# Patient Record
Sex: Female | Born: 1975 | Race: White | Hispanic: No | Marital: Married | Smoking: Former smoker
Health system: Southern US, Community
[De-identification: ages and names within clinical notes are randomized; demographics above are authoritative.]

## PROBLEM LIST (undated history)

## (undated) DIAGNOSIS — R112 Nausea with vomiting, unspecified: Secondary | ICD-10-CM

## (undated) DIAGNOSIS — N871 Moderate cervical dysplasia: Secondary | ICD-10-CM

## (undated) DIAGNOSIS — R87629 Unspecified abnormal cytological findings in specimens from vagina: Secondary | ICD-10-CM

## (undated) DIAGNOSIS — F419 Anxiety disorder, unspecified: Secondary | ICD-10-CM

## (undated) DIAGNOSIS — F32A Depression, unspecified: Secondary | ICD-10-CM

## (undated) DIAGNOSIS — Z9889 Other specified postprocedural states: Secondary | ICD-10-CM

## (undated) DIAGNOSIS — B001 Herpesviral vesicular dermatitis: Secondary | ICD-10-CM

## (undated) DIAGNOSIS — N870 Mild cervical dysplasia: Secondary | ICD-10-CM

## (undated) DIAGNOSIS — D219 Benign neoplasm of connective and other soft tissue, unspecified: Secondary | ICD-10-CM

## (undated) DIAGNOSIS — F329 Major depressive disorder, single episode, unspecified: Secondary | ICD-10-CM

## (undated) HISTORY — DX: Nausea with vomiting, unspecified: Z98.890

## (undated) HISTORY — DX: Benign neoplasm of connective and other soft tissue, unspecified: D21.9

## (undated) HISTORY — DX: Moderate cervical dysplasia: N87.1

## (undated) HISTORY — DX: Major depressive disorder, single episode, unspecified: F32.9

## (undated) HISTORY — DX: Nausea with vomiting, unspecified: R11.2

## (undated) HISTORY — DX: Depression, unspecified: F32.A

## (undated) HISTORY — DX: Mild cervical dysplasia: N87.0

## (undated) HISTORY — DX: Anxiety disorder, unspecified: F41.9

## (undated) HISTORY — DX: Herpesviral vesicular dermatitis: B00.1

## (undated) HISTORY — DX: Unspecified abnormal cytological findings in specimens from vagina: R87.629

---

## 1997-08-18 ENCOUNTER — Other Ambulatory Visit: Admission: RE | Admit: 1997-08-18 | Discharge: 1997-08-18 | Payer: Self-pay | Admitting: Obstetrics and Gynecology

## 1997-10-06 ENCOUNTER — Other Ambulatory Visit: Admission: RE | Admit: 1997-10-06 | Discharge: 1997-10-06 | Payer: Self-pay | Admitting: Obstetrics and Gynecology

## 1997-12-30 ENCOUNTER — Other Ambulatory Visit: Admission: RE | Admit: 1997-12-30 | Discharge: 1997-12-30 | Payer: Self-pay | Admitting: Gynecology

## 1998-03-31 ENCOUNTER — Other Ambulatory Visit: Admission: RE | Admit: 1998-03-31 | Discharge: 1998-03-31 | Payer: Self-pay | Admitting: Gynecology

## 1998-06-30 ENCOUNTER — Other Ambulatory Visit: Admission: RE | Admit: 1998-06-30 | Discharge: 1998-06-30 | Payer: Self-pay | Admitting: Gynecology

## 1998-10-11 ENCOUNTER — Other Ambulatory Visit: Admission: RE | Admit: 1998-10-11 | Discharge: 1998-10-11 | Payer: Self-pay | Admitting: Gynecology

## 1999-05-22 ENCOUNTER — Other Ambulatory Visit: Admission: RE | Admit: 1999-05-22 | Discharge: 1999-05-22 | Payer: Self-pay | Admitting: Gynecology

## 2000-02-15 ENCOUNTER — Other Ambulatory Visit: Admission: RE | Admit: 2000-02-15 | Discharge: 2000-02-15 | Payer: Self-pay | Admitting: Gynecology

## 2001-02-25 HISTORY — PX: APPENDECTOMY: SHX54

## 2001-02-25 HISTORY — PX: MYOMECTOMY: SHX85

## 2001-03-27 ENCOUNTER — Other Ambulatory Visit: Admission: RE | Admit: 2001-03-27 | Discharge: 2001-03-27 | Payer: Self-pay | Admitting: Gynecology

## 2001-08-04 ENCOUNTER — Inpatient Hospital Stay (HOSPITAL_COMMUNITY): Admission: RE | Admit: 2001-08-04 | Discharge: 2001-08-06 | Payer: Self-pay | Admitting: Gynecology

## 2001-08-04 ENCOUNTER — Encounter (INDEPENDENT_AMBULATORY_CARE_PROVIDER_SITE_OTHER): Payer: Self-pay | Admitting: *Deleted

## 2001-08-04 ENCOUNTER — Encounter (INDEPENDENT_AMBULATORY_CARE_PROVIDER_SITE_OTHER): Payer: Self-pay

## 2002-04-27 ENCOUNTER — Other Ambulatory Visit: Admission: RE | Admit: 2002-04-27 | Discharge: 2002-04-27 | Payer: Self-pay | Admitting: Gynecology

## 2003-06-30 ENCOUNTER — Other Ambulatory Visit: Admission: RE | Admit: 2003-06-30 | Discharge: 2003-06-30 | Payer: Self-pay | Admitting: Obstetrics and Gynecology

## 2004-08-22 ENCOUNTER — Other Ambulatory Visit: Admission: RE | Admit: 2004-08-22 | Discharge: 2004-08-22 | Payer: Self-pay | Admitting: Addiction Medicine

## 2005-05-09 ENCOUNTER — Encounter: Admission: RE | Admit: 2005-05-09 | Discharge: 2005-05-09 | Payer: Self-pay | Admitting: Obstetrics and Gynecology

## 2005-08-23 ENCOUNTER — Other Ambulatory Visit: Admission: RE | Admit: 2005-08-23 | Discharge: 2005-08-23 | Payer: Self-pay | Admitting: Obstetrics and Gynecology

## 2006-08-26 ENCOUNTER — Other Ambulatory Visit: Admission: RE | Admit: 2006-08-26 | Discharge: 2006-08-26 | Payer: Self-pay | Admitting: Obstetrics and Gynecology

## 2007-09-03 ENCOUNTER — Other Ambulatory Visit: Admission: RE | Admit: 2007-09-03 | Discharge: 2007-09-03 | Payer: Self-pay | Admitting: Obstetrics and Gynecology

## 2008-09-06 ENCOUNTER — Encounter: Payer: Self-pay | Admitting: Obstetrics and Gynecology

## 2008-09-06 ENCOUNTER — Ambulatory Visit: Payer: Self-pay | Admitting: Obstetrics and Gynecology

## 2008-09-06 ENCOUNTER — Other Ambulatory Visit: Admission: RE | Admit: 2008-09-06 | Discharge: 2008-09-06 | Payer: Self-pay | Admitting: Obstetrics and Gynecology

## 2010-02-27 ENCOUNTER — Ambulatory Visit
Admission: RE | Admit: 2010-02-27 | Discharge: 2010-02-27 | Payer: Self-pay | Source: Home / Self Care | Attending: Obstetrics and Gynecology | Admitting: Obstetrics and Gynecology

## 2010-02-27 ENCOUNTER — Other Ambulatory Visit
Admission: RE | Admit: 2010-02-27 | Discharge: 2010-02-27 | Payer: Self-pay | Source: Home / Self Care | Admitting: Obstetrics and Gynecology

## 2010-07-13 NOTE — Op Note (Signed)
[facility]  Patient:    Rebecca Howell, Rebecca Howell Visit Number: [id number] MRN: [id number]          Service Type: GYN Location: 9300 [id number] Attending Physician:  Douglass Rivers Dictated by:   Douglass Rivers, M.D. Proc. Date: 08/04/01 Admit Date:  08/04/2001                             Operative Report  PREOPERATIVE DIAGNOSES:       1. Pelvic pain.                               2. Fibroid uterus.  POSTOPERATIVE DIAGNOSES:      1. Pelvic pain.                               2. Fibroid uterus.  OPERATION:                    Myomectomy.  SURGEON:                      Douglass Rivers, M.D.  ASSISTANT:                    Nadyne Coombes. Fontaine, M.D.  ANESTHESIA:                   Spinal.  ESTIMATED BLOOD LOSS:         Minimal.  IV FLUIDS:                    Two liters of lactated Ringers.  URINE OUTPUT:                 50 cc.  FINDINGS:                     Large approximately 10 cm fundal fibroid on a thick stalk.  The uterus was markedly deviated towards the right, otherwise unremarkable.  COMPLICATIONS:                None.  PATHOLOGY:                    Fibroids.  DESCRIPTION OF PROCEDURE:     The patient was taken to the operating room and spinal anesthesia was induced.  She was placed in the supine position, prepped and draped in the usual sterile fashion.  After adequate anesthesia was ensured, a Pfannenstiel skin incision was made with the scalpel and carried to the underlying layer of fascia with electrocautery.  The fascia was scored in the midline.  The incision was extended with the Mayo scissors.  The inferior rectus fascia was grasped with Kochers and the rectus muscles were dissected off with blunt and sharp dissection.  In a similar fashion, the superior aspect of the incision was grasped with Kochers and the underlying rectus muscles were dissected off.  The rectus muscles were dissected in the midline. The peritoneum was identified and  entered bluntly.  The peritoneal incision was then extended superiorly and inferiorly.  Good visualization of the underlying bowel and bladder.  Pelvic washings were obtained, after which, the examining hand was placed in the pelvis.  The uterus and fibroid were palpable.  The fibroid was able to elevated up into the  incision and was stabilized with a Jacobs tenaculum.  Inspection showed that the fibroid was actually pedunculated, however, on a very thick stalk, extending in the high fundal region, but was noted to be free of the tube.  The inferior most aspect of the fibroid was then injected with a dilute Pitressin solution until blanching of the major vessels was noted.  The serosa was then scored circumferentially and the fibroid capsule was able to be identified and sharply dissected off.  There was essentially no fundal defect.  The defect was closed with two deep sutures of 1-0 Vicryl.  The serosa was then closed with a subfascial 3-0 plain.  Small areas of bleeding were treated with cautery.  Interceed was then placed over the incision and the uterus was gently replaced back into the pelvic cavity, prior to which the pelvis was irrigated with warm saline.  The peritoneum and muscles were noted to be hemostatic.  The fascia was then closed with 0 Vicryl in a running fashion. The subcutaneous was irrigated and the skin was closed with staples.  The patient tolerated the procedure well.  Instrument, sponge, and needle counts were correct x2.  She was given Ancef intraoperatively and transferred to the PACU in stable condition. Dictated by:   Douglass Rivers, M.D. Attending Physician:  Douglass Rivers DD:  08/04/01 TD:  08/05/01 Job: [id number]

## 2010-07-13 NOTE — Discharge Summary (Signed)
[facility]  Patient:    Rebecca Howell, Rebecca Howell Visit Number: [id number] MRN: [id number]          Service Type: GYN Location: 9300 [id number] Attending Physician:  Douglass Rivers Dictated by:   Antony Contras, [facility] Admit Date:  08/04/2001 Discharge Date: 08/06/2001                             Discharge Summary  DISCHARGE DIAGNOSES:          1. Pelvic pain.                               2. Fibroid uterus.  PROCEDURE:                    Myomectomy.  HISTORY OF PRESENT ILLNESS:   The patient is a 35 year old who was noted on a routine annual examination to have fullness in the right adnexa.  She was having breakthrough bleeding on her birth control pills and presented back for a sonohysterogram, which confirmed the absence of endometrial cavity defects but a distinct large fibroid that initially appeared subserosal and was more obviously noted to be pedunculated on a fixed stalk with color-flow Doppler that measured 8 x 6 x 6.4 x 6.9.  Ovaries were identified and were unremarkable.  The patient is otherwise healthy, negative a negative medical and surgical history, and no known drug allergies.  She was admitted for myomectomy.  HOSPITAL COURSE:              The patient was admitted on August 04, 2001 and myomectomy was performed by Dr. Douglass Rivers, assisted by Dr. Colin Broach under spinal anesthesia.  Findings included a large approximately 10 cm fundal fibroid on a thick stalk.  The uterus was markedly deviated toward the right , otherwise unremarkable.  Postoperatively the patient remained afebrile and had no difficulty voiding, and was able to be discharged on her second postoperative day in satisfactory condition.  LABORATORY DATA:              CBC hematocrit 34.4, hemoglobin 11.8, WBC 6.8; platelets 220,000.  DISPOSITION:                  Follow-up in two weeks.  DISCHARGE MEDICATIONS:        1. Tylox for pain.                               2.  Zofran ODT 4 mg q.8h p.r.n. nausea and                                  vomiting. Dictated by:   Antony Contras, [facility] Attending Physician:  Douglass Rivers DD:  08/31/01 TD:  09/02/01 Job: [id number]

## 2010-07-13 NOTE — H&P (Signed)
[facility]  Patient:    Rebecca Howell, Rebecca Howell Visit Number: [id number] MRN: [id number]          Service Type: GYN Location: 9300 [id number] Attending Physician:  Douglass Rivers Dictated by:   Douglass Rivers, M.D. Admit Date:  08/04/2001                           History and Physical  CHIEF COMPLAINT:              Pelvic pain, fibroid uterus.  HISTORY OF PRESENT ILLNESS:   The patient is a 35 year old who was noted on the routine annual examination to have fullness in the right adnexa.  The uterus was palpated distinctly separate from this area.  The patient then underwent an ultrasound which revealed the uterus to be 8.2 x 4.1 x 3.8 with what was felt to be a subserosal myoma that measured 5.8 x 8.3. x 8.2.  The patient was having breakthrough bleeding on her birth control pills at that point and presented back to the office for a sonohistogram.  The sonohistogram confirmed the absence of endometrial cavity defects.  However, a distinct large fibroid that initially appeared subserosal was more obviously noted to be pedunculated on a fixed stalk with a color flow Doppler that measured 8.6 x 6.4 x 6.9 cm.  The ovaries were identified and unremarkable.  PAST OBSTETRIC-GYNECOLOGIC HISTORY:                      Negative.  Patient is using birth control pills and condoms for contraception.  She has been in a monogamous relationship for over a year.  Her Pap smear is normal.  PAST MEDICAL HISTORY:         Negative.  PAST SURGICAL HISTORY:        Negative.  SOCIAL HISTORY:               Negative for tobacco, minimal caffeine, social alcohol, and a moderate amount of exercise.  ALLERGIES:                    Patient has no known drug allergies.  FAMILY HISTORY:               Unremarkable.  PHYSICAL EXAMINATION:  GENERAL:                      She is a well-appearing female in no acute distress.  HEENT:                        Unremarkable.  Thyroid is  smooth, nontender, and mobile.  HEART:                        Regular rate.  LUNGS:                        Clear to auscultation.  BREASTS:                      Without mass, discharge, retractions in the supine and upright position.  ABDOMEN:                      Soft, nontender.  PELVIC:  Speculum exam is normal-appearing cervix.  The vagina is pink and moist.  The uterus is deviated towards the left and unremarkable.  The fullness on the right is appreciable and pushing on the rectum.  The rectovaginal exam is confirmatory.  ASSESSMENT:                   Symptomatic pedunculated fibroid uterus.  The patient elects for a myomectomy.  All questions were addressed.  She will present in the morning for definitive therapy. Dictated by:   Douglass Rivers, M.D. Attending Physician:  Douglass Rivers DD:  08/03/01 TD:  08/03/01 Job: [id number]

## 2010-09-13 ENCOUNTER — Ambulatory Visit (INDEPENDENT_AMBULATORY_CARE_PROVIDER_SITE_OTHER): Payer: 59 | Admitting: Obstetrics and Gynecology

## 2010-09-13 DIAGNOSIS — N390 Urinary tract infection, site not specified: Secondary | ICD-10-CM

## 2010-09-13 DIAGNOSIS — R82998 Other abnormal findings in urine: Secondary | ICD-10-CM

## 2010-09-24 ENCOUNTER — Ambulatory Visit (INDEPENDENT_AMBULATORY_CARE_PROVIDER_SITE_OTHER): Payer: 59 | Admitting: *Deleted

## 2010-09-24 DIAGNOSIS — R82998 Other abnormal findings in urine: Secondary | ICD-10-CM

## 2010-09-24 DIAGNOSIS — Z5189 Encounter for other specified aftercare: Secondary | ICD-10-CM

## 2010-09-25 ENCOUNTER — Telehealth: Payer: Self-pay

## 2010-09-25 DIAGNOSIS — N390 Urinary tract infection, site not specified: Secondary | ICD-10-CM

## 2010-09-25 MED ORDER — CIPROFLOXACIN HCL 500 MG PO TABS
250.0000 mg | ORAL_TABLET | Freq: Two times a day (BID) | ORAL | Status: AC
Start: 2010-09-25 — End: 2010-10-05

## 2010-09-25 NOTE — Telephone Encounter (Signed)
I changed patient's antibiotic and called in for her. Tell the patient is waiting at the pharmacy. If this doesn't work I would like her to come in for a pelvic ultrasound.

## 2010-09-25 NOTE — Telephone Encounter (Signed)
PT. NOTIFIED BY CELL # VOICEMAIL. SEE DR. GOTTSEGENS NOTE BELOW.

## 2010-09-25 NOTE — Telephone Encounter (Signed)
PER DR. GOTTSEGEN IS PT ASYMPTOMATIC? NO PER PATIENT-C-O MILD PELVIC DISCOMFORT. PHARMACY UPDATED IF YOU NEED TO GIVE HER MORE RX. IT CAN BE ESCRIBED BY YOU & THEN CLOSE ENCOUNTER.

## 2010-09-26 ENCOUNTER — Telehealth: Payer: Self-pay

## 2010-09-26 NOTE — Telephone Encounter (Signed)
If they gave her 500 mg tablets than half a tablet twice a day as correct. If they gave her 250 mg tablets then half a tablet twice a day is not correct.

## 2010-09-26 NOTE — Telephone Encounter (Signed)
PT STATES SHE RECEIVED 250MG . STRENGTH. THAT WAS WHAT WAS ROUTED TO HER PHARMACY. DO YOU WANT TO ECRIBE MORE PILLS OF CORRECT STRENGTH & HOW MANY DAYS TO TAKE ALL TOGETHER? #7DAYS?

## 2010-09-26 NOTE — Telephone Encounter (Signed)
PT. PICKED UP [facility] & WANTED TO VERIFY DIRECTIONS. IT STATES TAKE 1/2 PILL BID-IS THIS CORRECT?

## 2010-09-26 NOTE — Telephone Encounter (Signed)
The dose should be 250 mg twice a day for 7 days.

## 2010-09-26 NOTE — Telephone Encounter (Signed)
VERIFIED DOSE WITH [facility] & NOTIFIED PT OF DR. GOTTTSEGENS NOTE BELOW BY CELL #[email or web address].

## 2010-10-15 ENCOUNTER — Encounter: Payer: Self-pay | Admitting: *Deleted

## 2010-10-15 ENCOUNTER — Telehealth: Payer: Self-pay | Admitting: *Deleted

## 2010-10-15 ENCOUNTER — Other Ambulatory Visit: Payer: Self-pay | Admitting: *Deleted

## 2010-10-15 DIAGNOSIS — B001 Herpesviral vesicular dermatitis: Secondary | ICD-10-CM | POA: Insufficient documentation

## 2010-10-15 DIAGNOSIS — N870 Mild cervical dysplasia: Secondary | ICD-10-CM | POA: Insufficient documentation

## 2010-10-15 DIAGNOSIS — N871 Moderate cervical dysplasia: Secondary | ICD-10-CM | POA: Insufficient documentation

## 2010-10-15 DIAGNOSIS — N949 Unspecified condition associated with female genital organs and menstrual cycle: Secondary | ICD-10-CM

## 2010-10-15 NOTE — Telephone Encounter (Signed)
Patient called with ongoing problem with uti symptoms.  Per DG note, he wanted to check an U/S here in the office.  We went ahead and scheduled for patient on Wed Aug 22nd. Patient will be out of town on [place] and Fri. Patient did start period, but is sure things will be lighter when time to come in for appoinment.

## 2010-10-16 NOTE — Telephone Encounter (Signed)
Patient called and cancelled the [place] scheduled for tomorrow. She believes the her discomfort was probably from her period starting. She will back if Sx's persist. KW

## 2010-10-17 ENCOUNTER — Ambulatory Visit: Payer: 59 | Admitting: Obstetrics and Gynecology

## 2010-10-17 ENCOUNTER — Other Ambulatory Visit: Payer: 59

## 2010-11-12 ENCOUNTER — Other Ambulatory Visit: Payer: Self-pay | Admitting: Obstetrics and Gynecology

## 2010-11-13 NOTE — Telephone Encounter (Addendum)
PHARMACY ALSO FAXED OVER RX. SEE ON FRONT OF CHART.

## 2010-11-14 NOTE — Telephone Encounter (Signed)
SENT FAX TO PHARMACY SENT RX CAN NOT BE E-SCRIBED.

## 2011-02-06 ENCOUNTER — Other Ambulatory Visit: Payer: Self-pay | Admitting: *Deleted

## 2011-02-06 MED ORDER — ALPRAZOLAM 0.5 MG PO TABS: ORAL_TABLET | ORAL | Status: DC

## 2011-02-06 NOTE — Telephone Encounter (Signed)
rx called in

## 2011-05-23 ENCOUNTER — Other Ambulatory Visit: Payer: Self-pay | Admitting: Women's Health

## 2011-05-23 NOTE — Telephone Encounter (Signed)
rx called in

## 2011-06-12 ENCOUNTER — Encounter: Payer: Self-pay | Admitting: Obstetrics and Gynecology

## 2011-06-12 ENCOUNTER — Other Ambulatory Visit (HOSPITAL_COMMUNITY)
Admission: RE | Admit: 2011-06-12 | Discharge: 2011-06-12 | Disposition: A | Discharge status: Home / Self Care | Payer: 59 | Source: Ambulatory Visit | Attending: Obstetrics and Gynecology | Admitting: Obstetrics and Gynecology

## 2011-06-12 ENCOUNTER — Ambulatory Visit (INDEPENDENT_AMBULATORY_CARE_PROVIDER_SITE_OTHER): Payer: 59 | Admitting: Obstetrics and Gynecology

## 2011-06-12 VITALS — BP 114/64 | Ht 63.5 in | Wt 123.0 lb

## 2011-06-12 DIAGNOSIS — Z113 Encounter for screening for infections with a predominantly sexual mode of transmission: Secondary | ICD-10-CM

## 2011-06-12 DIAGNOSIS — Z01419 Encounter for gynecological examination (general) (routine) without abnormal findings: Secondary | ICD-10-CM | POA: Insufficient documentation
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
## 2011-06-13 LAB — CBC WITH DIFFERENTIAL/PLATELET
Eosinophils Absolute: 0.2 10*3/uL (ref 0.0–0.7)
Eosinophils Relative: 2 % (ref 0–5)
HCT: 43.6 % (ref 36.0–46.0)
Lymphs Abs: 2.1 10*3/uL (ref 0.7–4.0)
MCH: 31.2 pg (ref 26.0–34.0)
Monocytes Absolute: 0.5 10*3/uL (ref 0.1–1.0)
Platelets: 267 10*3/uL (ref 150–400)
RBC: 4.75 MIL/uL (ref 3.87–5.11)

## 2011-06-13 LAB — URINALYSIS W MICROSCOPIC + REFLEX CULTURE
Casts: NONE SEEN
Crystals: NONE SEEN
Ketones, ur: 15 mg/dL — AB
Leukocytes, UA: NEGATIVE
Protein, ur: NEGATIVE mg/dL
Urobilinogen, UA: 0.2 mg/dL (ref 0.0–1.0)

## 2011-06-13 LAB — GC/CHLAMYDIA PROBE AMP, GENITAL
Chlamydia, DNA Probe: NEGATIVE
GC Probe Amp, Genital: NEGATIVE

## 2011-06-13 LAB — HEPATITIS B SURFACE ANTIGEN: Hepatitis B Surface Ag: NEGATIVE

## 2011-06-18 ENCOUNTER — Telehealth: Payer: Self-pay | Admitting: *Deleted

## 2011-06-18 NOTE — Telephone Encounter (Signed)
Pt informed of recent lab results.  

## 2011-06-25 ENCOUNTER — Other Ambulatory Visit: Payer: Self-pay | Admitting: Women's Health

## 2011-07-03 ENCOUNTER — Encounter: Payer: Self-pay | Admitting: *Deleted

## 2011-07-03 NOTE — Progress Notes (Signed)
Patient ID: Rebecca Howell, female   DOB: October 21, 1975, 36 y.o.   MRN: [id number] Xanax refill from 06/25/11 called into [facility] [phone].  rx was not routed back to be called in.

## 2011-07-09 ENCOUNTER — Other Ambulatory Visit: Payer: Self-pay | Admitting: Obstetrics and Gynecology

## 2011-08-05 ENCOUNTER — Other Ambulatory Visit: Payer: Self-pay | Admitting: Obstetrics and Gynecology

## 2011-08-06 NOTE — Telephone Encounter (Signed)
Patient informed. 

## 2011-08-06 NOTE — Telephone Encounter (Signed)
rx called in

## 2011-10-16 ENCOUNTER — Other Ambulatory Visit: Payer: Self-pay | Admitting: Obstetrics and Gynecology

## 2011-10-16 NOTE — Telephone Encounter (Signed)
rx called in

## 2012-02-14 ENCOUNTER — Other Ambulatory Visit: Payer: Self-pay | Admitting: Obstetrics and Gynecology

## 2012-04-06 ENCOUNTER — Encounter: Payer: Self-pay | Admitting: Gynecology

## 2012-04-06 ENCOUNTER — Ambulatory Visit (INDEPENDENT_AMBULATORY_CARE_PROVIDER_SITE_OTHER): Payer: 59 | Admitting: Gynecology

## 2012-04-06 DIAGNOSIS — F411 Generalized anxiety disorder: Secondary | ICD-10-CM

## 2012-04-06 DIAGNOSIS — F419 Anxiety disorder, unspecified: Secondary | ICD-10-CM | POA: Insufficient documentation

## 2012-04-06 DIAGNOSIS — F32A Depression, unspecified: Secondary | ICD-10-CM | POA: Insufficient documentation

## 2012-04-06 DIAGNOSIS — F329 Major depressive disorder, single episode, unspecified: Secondary | ICD-10-CM

## 2012-04-06 DIAGNOSIS — N644 Mastodynia: Secondary | ICD-10-CM

## 2012-04-06 MED ORDER — DULOXETINE HCL 60 MG PO CPEP: 60.0000 mg | ORAL_CAPSULE | Freq: Every day | ORAL | Status: DC

## 2012-04-06 NOTE — Patient Instructions (Addendum)
Breast Tenderness Breast tenderness is a common complaint made by women of all ages. It is also called mastalgia or mastodynia, which means breast pain. The condition can range from mild discomfort to severe pain. It has a variety of causes. Your caregiver will find out the likely cause of your breast tenderness by examining your breasts, asking you about symptoms and perhaps ordering some tests. Breast tenderness usually does not mean you have breast cancer. CAUSES  Breast tenderness has many possible causes. They include:  Premenstrual changes. A week to 10 days before your period, your breasts might ache or feel tender.  Other hormonal causes. These include:  When sexual and physical traits mature (puberty).  Pregnancy.  The time right before and the year after menopause (perimenopause).  The day when it has been 12 months since your last period (menopause).  Large breasts.  Infection (also called mastitis).  Birth control pills.  Breastfeeding. Tenderness can occur if the breasts are overfull with milk or if a milk duct is blocked.  Injury.  Fibrocystic breast changes. This is not cancer (benign). It causes painful breasts that feel lumpy.  Fluid-filled sacs (cysts). Often cysts can be drained in your healthcare provider's office.  Fibroadenoma. This is a tumor that is not cancerous.  Medication side effects. Blood pressure drugs and diuretics (which increase urine flow) sometimes cause breast tenderness.  Previous breast surgery, such as a breast reduction.  Breast cancer. Cancer is rarely the reason breasts are tender. In most women, tenderness is caused by something else. DIAGNOSIS  Several methods can be used to find out why your breasts are tender. They include:  Visual inspection of the breasts.  Examination by hand.  Tests, such as:  Mammogram.  Ultrasound.  Biopsy.  Lab test of any fluid coming from the nipple.  Blood tests.  MRI. TREATMENT    Treatment is directed to the cause of the breast tenderness from doing nothing for minor discomfort, wearing a good support bra but also may include:  Taking over-the-counter medicines for pain or discomfort as directed by your caregiver.  Prescription medicine for breast tenderness related to:  Premenstrual.  Fibrocystic.  Puberty.  Pregnancy.  Menopause.  Previous breast surgery.  Large breasts.  Antibiotics for infection.  Birth control pills for fibrocystic and premenstrual changes.  More frequent feedings or pumping of the breasts and warm compresses for breast engorgement when nursing.  Cold and warm compresses and a good support bra for most breast injuries.  Breast cysts are sometimes drained with a needle (aspiration) or removed with minor surgery.  Fibroadenomas are usually removed with minor surgery.  Changing or stopping the medicine when it is responsible for causing the breast tenderness.  When breast cancer is present with or without causing pain, it is usually treated with major surgery (with or without radiation) and chemotherapy. HOME CARE INSTRUCTIONS  Breast tenderness often can be handled at home. You can try:  Getting fitted for a new bra that provides more support, especially during exercise.  Wearing a more supportive or sports bra while sleeping when your breasts are very tender.  If you have a breast injury, using an ice pack for 15 to 20 minutes. Wrap the pack in a towel. Do not put the ice pack directly on your breast.  If your breasts are too full of milk as a result of breastfeeding, try:  Expressing milk either by hand or with a breast pump.  Applying a warm compress for relief.    over-the-counter pain relievers, if this is OK with your caregiver.  Taking medicine that your caregiver prescribes. These might include antibiotics or birth control pills. Over the long term, your breast tenderness might be eased if you:  Cut  down on caffeine.  Reduce the amount of fat in your diet. Also, learn how to do breast examinations at home. This will help you tell when you have an unusual growth or lump that could cause tenderness. And keep a log of the days and times when your breasts are most tender. This will help you and your caregiver find the right solution. SEEK MEDICAL CARE IF:   Any part of your breast is hard, red and hot to the touch. This could be a sign of infection.  Fluid is coming out of your nipples (and you are not breastfeeding). Especially watch for blood or pus.  You have a fever as well as breast tenderness.  You have a new or painful lump in your breast that remains after your period ends.  You have tried to take care of the pain at home, but it has not gone away.  Your breast pain is getting worse. Or, the pain is making it hard to do the things you usually do during your day. Document Released: 01/25/2008 Document Revised: 05/06/2011 Document Reviewed: 01/25/2008 [facility] Patient Information 2013 [place], [place].  Depression, Adult Depression refers to feeling sad, low, down in the dumps, blue, gloomy, or empty. In general, there are two kinds of depression: 1. Depression that we all experience from time to time because of upsetting life experiences, including the loss of a job or the ending of a relationship (normal sadness or normal grief). This kind of depression is considered normal, is short lived, and resolves within a few days to 2 weeks. (Depression experienced after the loss of a loved one is called bereavement. Bereavement often lasts longer than 2 weeks but normally gets better with time.) 2. Clinical depression, which lasts longer than normal sadness or normal grief or interferes with your ability to function at home, at work, and in school. It also interferes with your personal relationships. It affects almost every aspect of your life. Clinical depression is an illness. Symptoms of  depression also can be caused by conditions other than normal sadness and grief or clinical depression. Examples of these conditions are listed as follows:  Physical illness Some physical illnesses, including underactive thyroid gland (hypothyroidism), severe anemia, specific types of cancer, diabetes, uncontrolled seizures, heart and lung problems, strokes, and chronic pain are commonly associated with symptoms of depression.  Side effects of some prescription medicine In some people, certain types of prescription medicine can cause symptoms of depression.  Substance abuse Abuse of alcohol and illicit drugs can cause symptoms of depression. SYMPTOMS Symptoms of normal sadness and normal grief include the following:  Feeling sad or crying for short periods of time.  Not caring about anything (apathy).  Difficulty sleeping or sleeping too much.  No longer able to enjoy the things you used to enjoy.  Desire to be by oneself all the time (social isolation).  Lack of energy or motivation.  Difficulty concentrating or remembering.  Change in appetite or weight.  Restlessness or agitation. Symptoms of clinical depression include the same symptoms of normal sadness or normal grief and also the following symptoms:  Feeling sad or crying all the time.  Feelings of guilt or worthlessness.  Feelings of hopelessness or helplessness.  Thoughts of suicide or the desire  to harm yourself (suicidal ideation).  Loss of touch with reality (psychotic symptoms). Seeing or hearing things that are not real (hallucinations) or having false beliefs about your life or the people around you (delusions and paranoia). DIAGNOSIS  The diagnosis of clinical depression usually is based on the severity and duration of the symptoms. Your caregiver also will ask you questions about your medical history and substance use to find out if physical illness, use of prescription medicine, or substance abuse is causing  your depression. Your caregiver also may order blood tests. TREATMENT  Typically, normal sadness and normal grief do not require treatment. However, sometimes antidepressant medicine is prescribed for bereavement to ease the depressive symptoms until they resolve. The treatment for clinical depression depends on the severity of your symptoms but typically includes antidepressant medicine, counseling with a mental health professional, or a combination of both. Your caregiver will help to determine what treatment is best for you. Depression caused by physical illness usually goes away with appropriate medical treatment of the illness. If prescription medicine is causing depression, talk with your caregiver about stopping the medicine, decreasing the dose, or substituting another medicine. Depression caused by abuse of alcohol or illicit drugs abuse goes away with abstinence from these substances. Some adults need professional help in order to stop drinking or using drugs. SEEK IMMEDIATE CARE IF:  You have thoughts about hurting yourself or others.  You lose touch with reality (have psychotic symptoms).  You are taking medicine for depression and have a serious side effect. FOR MORE INFORMATION National [employer] on Mental Illness: www.nami.[employer]: http://www.maynard.net/ Document Released: 02/09/2000 Document Revised: 08/13/2011 Document Reviewed: 05/13/2011 [facility] Patient Information 2013 [place], [place].  Anxiety and Panic Attacks Your caregiver has informed you that you are having an anxiety or panic attack. There may be many forms of this. Most of the time these attacks come suddenly and without warning. They come at any time of day, including periods of sleep, and at any time of life. They may be strong and unexplained. Although panic attacks are very scary, they are physically harmless. Sometimes the cause of your anxiety is not known. Anxiety is a protective  mechanism of the body in its fight or flight mechanism. Most of these perceived danger situations are actually nonphysical situations (such as anxiety over losing a job). CAUSES  The causes of an anxiety or panic attack are many. Panic attacks may occur in otherwise healthy people given a certain set of circumstances. There may be a genetic cause for panic attacks. Some medications may also have anxiety as a side effect. SYMPTOMS  Some of the most common feelings are:  Intense terror.  Dizziness, feeling faint.  Hot and cold flashes.  Fear of going crazy.  Feelings that nothing is real.  Sweating.  Shaking.  Chest pain or a fast heartbeat (palpitations).  Smothering, choking sensations.  Feelings of impending doom and that death is near.  Tingling of extremities, this may be from over-breathing.  Altered reality (derealization).  Being detached from yourself (depersonalization). Several symptoms can be present to make up anxiety or panic attacks. DIAGNOSIS  The evaluation by your caregiver will depend on the type of symptoms you are experiencing. The diagnosis of anxiety or panic attack is made when no physical illness can be determined to be a cause of the symptoms. TREATMENT  Treatment to prevent anxiety and panic attacks may include:  Avoidance of circumstances that cause anxiety.  Reassurance and relaxation.  Regular exercise.  Relaxation therapies, such as yoga.  Psychotherapy with a psychiatrist or therapist.  Avoidance of caffeine, alcohol and illegal drugs.  Prescribed medication. SEEK IMMEDIATE MEDICAL CARE IF:   You experience panic attack symptoms that are different than your usual symptoms.  You have any worsening or concerning symptoms. Document Released: 02/11/2005 Document Revised: 05/06/2011 Document Reviewed: 06/15/2009 [facility] Patient Information 2013 [place], [place].  Recommend Vit E 600 Units one tablet daily to decrease breast   sensitivity

## 2012-04-06 NOTE — Progress Notes (Signed)
Patient is a 37 year old who presented to the office today complaining of left upper outer quadrant breast tenderness for the past 5 days. Patient denies any recent trauma. She denies any nipple discharge. She does have a grandmother with history of breast cancer. She is using condoms for contraception. She states her cycles are regular.  Patient also brought to my attention that in the past she had history of depression and anxiety and had been on Paxil approximately 10 years ago but had difficulty tapering off. She has taken Xanax in the past and on a when necessary basis. She's having issues at home with her family and network and she's having difficult time coping but she does have good family support and denies any suicidal ideation.  Exam: Breast: Both breasts were examined sitting supine position both breasts were symmetrical in appearance no skin discoloration or nipple inversion no palpable masses no supraclavicular axillary lymphadenopathy. Patient was tender in the left upper outer quadrant near the tail of Spence.  Assessment/plan: Problem #1: Patient with history of depression and anxiety will be started on Cymbalta 60 mg one by mouth daily. The risks benefits and pros and cons of medication were discussed. She will be referred to the therapist and Emberley Kral for continuation of her treatment and for depression and anxiety.  Problem #2: [facility] and ultrasound of the left breast. I've recommended the patient discontinue all caffeine-containing products for the next couple weeks. I've also recommended that she start taking vitamin E 600 units daily. I also recommended that she not use any bra when she is at home to minimize any pressure from the bra strap. Patient scheduled to return back in 3 months for her annual exam and we'll reassess.

## 2012-04-07 ENCOUNTER — Telehealth: Payer: Self-pay | Admitting: *Deleted

## 2012-04-07 DIAGNOSIS — N644 Mastodynia: Secondary | ICD-10-CM

## 2012-04-07 NOTE — Telephone Encounter (Signed)
Order placed for diag. Mammogram with ultrasound, number given to pt to call Berniece Andreas to set up appointment

## 2012-04-07 NOTE — Telephone Encounter (Signed)
Appointment 04/20/12 @ 8:40 am

## 2012-04-07 NOTE — Telephone Encounter (Signed)
Message copied by Aura Camps on Tue Apr 07, 2012  8:46 AM ------      Message from: Ok Edwards      Created: Mon Apr 06, 2012  3:33 PM       Please make appointment for this patient for:            1. Left breast diagnostic mammogram with possible ultrasound due to LUQ breast pains with family history of breast ca.            2. Appointment with Berniece Andreas for patient with depression and anxiety/panic attacks just started on Cymbalta ------

## 2012-04-14 ENCOUNTER — Telehealth: Payer: Self-pay | Admitting: *Deleted

## 2012-04-14 MED ORDER — SERTRALINE HCL 50 MG PO TABS: 50.0000 mg | ORAL_TABLET | Freq: Every day | ORAL | Status: DC

## 2012-04-14 NOTE — Telephone Encounter (Signed)
Result Cymbalta and start Zoloft 50MG  qd # 30 refill x 11. Did she follow up with therapist Pearlee Arvizu as recommended?

## 2012-04-14 NOTE — Telephone Encounter (Signed)
PT INFORMED WITH THE BELOW NOTE, SHE HAS NOT YET, PT WILL CALL HER TODAY TO SCHEDULE.

## 2012-04-14 NOTE — Telephone Encounter (Signed)
Pt was given Rx for cymbalt 60 mg on 04/06/12 OV, she took for the first time yesterday and c/o feeling very tired, and nausea. She did not take medication this am. And still feel like she has been "drugged". Pt asked if anything else could be given? Please advise

## 2012-04-20 ENCOUNTER — Ambulatory Visit
Admission: RE | Admit: 2012-04-20 | Discharge: 2012-04-20 | Disposition: A | Discharge status: Home / Self Care | Payer: 59 | Source: Ambulatory Visit | Attending: Gynecology | Admitting: Gynecology

## 2012-04-20 DIAGNOSIS — N644 Mastodynia: Secondary | ICD-10-CM

## 2012-04-22 ENCOUNTER — Ambulatory Visit (INDEPENDENT_AMBULATORY_CARE_PROVIDER_SITE_OTHER): Payer: 59 | Admitting: Licensed Clinical Social Worker

## 2012-04-22 DIAGNOSIS — F331 Major depressive disorder, recurrent, moderate: Secondary | ICD-10-CM

## 2012-04-29 ENCOUNTER — Ambulatory Visit (INDEPENDENT_AMBULATORY_CARE_PROVIDER_SITE_OTHER): Payer: 59 | Admitting: Licensed Clinical Social Worker

## 2012-04-29 DIAGNOSIS — F321 Major depressive disorder, single episode, moderate: Secondary | ICD-10-CM

## 2012-05-06 ENCOUNTER — Ambulatory Visit (INDEPENDENT_AMBULATORY_CARE_PROVIDER_SITE_OTHER): Payer: 59 | Admitting: Licensed Clinical Social Worker

## 2012-05-06 DIAGNOSIS — F331 Major depressive disorder, recurrent, moderate: Secondary | ICD-10-CM

## 2012-05-20 ENCOUNTER — Ambulatory Visit (INDEPENDENT_AMBULATORY_CARE_PROVIDER_SITE_OTHER): Payer: 59 | Admitting: Licensed Clinical Social Worker

## 2012-05-20 DIAGNOSIS — F321 Major depressive disorder, single episode, moderate: Secondary | ICD-10-CM

## 2012-06-03 ENCOUNTER — Ambulatory Visit (INDEPENDENT_AMBULATORY_CARE_PROVIDER_SITE_OTHER): Payer: 59 | Admitting: Licensed Clinical Social Worker

## 2012-06-03 DIAGNOSIS — F321 Major depressive disorder, single episode, moderate: Secondary | ICD-10-CM

## 2012-06-24 ENCOUNTER — Ambulatory Visit (INDEPENDENT_AMBULATORY_CARE_PROVIDER_SITE_OTHER): Payer: 59 | Admitting: Licensed Clinical Social Worker

## 2012-06-24 DIAGNOSIS — F321 Major depressive disorder, single episode, moderate: Secondary | ICD-10-CM

## 2012-07-08 ENCOUNTER — Ambulatory Visit (INDEPENDENT_AMBULATORY_CARE_PROVIDER_SITE_OTHER): Payer: 59 | Admitting: Licensed Clinical Social Worker

## 2012-07-08 DIAGNOSIS — F321 Major depressive disorder, single episode, moderate: Secondary | ICD-10-CM

## 2012-07-22 ENCOUNTER — Ambulatory Visit (INDEPENDENT_AMBULATORY_CARE_PROVIDER_SITE_OTHER): Payer: 59 | Admitting: Licensed Clinical Social Worker

## 2012-07-22 DIAGNOSIS — F321 Major depressive disorder, single episode, moderate: Secondary | ICD-10-CM

## 2012-08-05 ENCOUNTER — Ambulatory Visit (INDEPENDENT_AMBULATORY_CARE_PROVIDER_SITE_OTHER): Payer: 59 | Admitting: Licensed Clinical Social Worker

## 2012-08-05 DIAGNOSIS — F321 Major depressive disorder, single episode, moderate: Secondary | ICD-10-CM

## 2012-08-18 ENCOUNTER — Other Ambulatory Visit: Payer: Self-pay | Admitting: Obstetrics and Gynecology

## 2012-08-19 ENCOUNTER — Ambulatory Visit: Payer: 59 | Admitting: Licensed Clinical Social Worker

## 2012-09-16 ENCOUNTER — Ambulatory Visit (INDEPENDENT_AMBULATORY_CARE_PROVIDER_SITE_OTHER): Payer: Self-pay | Admitting: Gynecology

## 2012-09-16 ENCOUNTER — Encounter: Payer: Self-pay | Admitting: Gynecology

## 2012-09-16 ENCOUNTER — Ambulatory Visit (INDEPENDENT_AMBULATORY_CARE_PROVIDER_SITE_OTHER): Payer: 59 | Admitting: Licensed Clinical Social Worker

## 2012-09-16 VITALS — BP 128/80

## 2012-09-16 DIAGNOSIS — IMO0001 Reserved for inherently not codable concepts without codable children: Secondary | ICD-10-CM

## 2012-09-16 DIAGNOSIS — R300 Dysuria: Secondary | ICD-10-CM

## 2012-09-16 DIAGNOSIS — N390 Urinary tract infection, site not specified: Secondary | ICD-10-CM

## 2012-09-16 DIAGNOSIS — R350 Frequency of micturition: Secondary | ICD-10-CM

## 2012-09-16 DIAGNOSIS — F321 Major depressive disorder, single episode, moderate: Secondary | ICD-10-CM

## 2012-09-16 LAB — URINALYSIS W MICROSCOPIC + REFLEX CULTURE
Bilirubin Urine: NEGATIVE
Crystals: NONE SEEN
Glucose, UA: NEGATIVE mg/dL
Ketones, ur: NEGATIVE mg/dL
Protein, ur: 100 mg/dL — AB
Specific Gravity, Urine: 1.02 (ref 1.005–1.030)
pH: 5.5 (ref 5.0–8.0)

## 2012-09-16 MED ORDER — FLUCONAZOLE 100 MG PO TABS
ORAL_TABLET | ORAL | Status: DC
Start: 2012-09-16 — End: 2013-05-19

## 2012-09-16 MED ORDER — CIPROFLOXACIN HCL 250 MG PO TABS: 250.0000 mg | ORAL_TABLET | Freq: Two times a day (BID) | ORAL | Status: DC

## 2012-09-16 NOTE — Patient Instructions (Addendum)
Urinary Tract Infection  Urinary tract infections (UTIs) can develop anywhere along your urinary tract. Your urinary tract is your body's drainage system for removing wastes and extra water. Your urinary tract includes two kidneys, two ureters, a bladder, and a urethra. Your kidneys are a pair of bean-shaped organs. Each kidney is about the size of your fist. They are located below your ribs, one on each side of your spine.  CAUSES  Infections are caused by microbes, which are microscopic organisms, including fungi, viruses, and bacteria. These organisms are so small that they can only be seen through a microscope. Bacteria are the microbes that most commonly cause UTIs.  SYMPTOMS   Symptoms of UTIs may vary by age and gender of the patient and by the location of the infection. Symptoms in young women typically include a frequent and intense urge to urinate and a painful, burning feeling in the bladder or urethra during urination. Older women and men are more likely to be tired, shaky, and weak and have muscle aches and abdominal pain. A fever may mean the infection is in your kidneys. Other symptoms of a kidney infection include pain in your back or sides below the ribs, nausea, and vomiting.  DIAGNOSIS  To diagnose a UTI, your caregiver will ask you about your symptoms. Your caregiver also will ask to provide a urine sample. The urine sample will be tested for bacteria and white blood cells. White blood cells are made by your body to help fight infection.  TREATMENT   Typically, UTIs can be treated with medication. Because most UTIs are caused by a bacterial infection, they usually can be treated with the use of antibiotics. The choice of antibiotic and length of treatment depend on your symptoms and the type of bacteria causing your infection.  HOME CARE INSTRUCTIONS   If you were prescribed antibiotics, take them exactly as your caregiver instructs you. Finish the medication even if you feel better after you  have only taken some of the medication.   Drink enough water and fluids to keep your urine clear or pale yellow.   Avoid caffeine, tea, and carbonated beverages. They tend to irritate your bladder.   Empty your bladder often. Avoid holding urine for long periods of time.   Empty your bladder before and after sexual intercourse.   After a bowel movement, women should cleanse from front to back. Use each tissue only once.  SEEK MEDICAL CARE IF:    You have back pain.   You develop a fever.   Your symptoms do not begin to resolve within 3 days.  SEEK IMMEDIATE MEDICAL CARE IF:    You have severe back pain or lower abdominal pain.   You develop chills.   You have nausea or vomiting.   You have continued burning or discomfort with urination.  MAKE SURE YOU:    Understand these instructions.   Will watch your condition.   Will get help right away if you are not doing well or get worse.  Document Released: 11/21/2004 Document Revised: 08/13/2011 Document Reviewed: 03/22/2011  [employer] Patient Information 2014 [employer].

## 2012-09-16 NOTE — Progress Notes (Signed)
37 year old who presented to the office complaining 2 days of dysuria, frequency, and low back discomfort. Patient denied any fever, chills, nausea, or vomiting. Patient denied any vaginal discharge. Patient using condoms for contraception. Patient having normal menstrual cycles.  Exam: Back: No CVA tenderness Abdomen: Soft nontender no rebound or guarding some slight suprapubic tenderness was noted  Urinalysis: WBC 21-50, RBC too numerous to count, bacteria few  Assessment/plan: Signs and symptoms as well as clinical evidence of urinary tract infection. The patient will be started on Cipro 250 mg one by mouth twice a day for 3 days. She was given sample of Uribell (antispasmodic agent) one by mouth 4 times a day for 2 days.

## 2012-09-19 LAB — URINE CULTURE: Colony Count: >=100000

## 2012-09-29 ENCOUNTER — Encounter: Payer: Self-pay | Admitting: Gynecology

## 2012-09-29 ENCOUNTER — Ambulatory Visit (INDEPENDENT_AMBULATORY_CARE_PROVIDER_SITE_OTHER): Payer: 59 | Admitting: Gynecology

## 2012-09-29 ENCOUNTER — Other Ambulatory Visit (HOSPITAL_COMMUNITY)
Admission: RE | Admit: 2012-09-29 | Discharge: 2012-09-29 | Disposition: A | Discharge status: Home / Self Care | Payer: 59 | Source: Ambulatory Visit | Attending: Gynecology | Admitting: Gynecology

## 2012-09-29 VITALS — BP 134/92 | Ht 63.5 in | Wt 126.0 lb

## 2012-09-29 DIAGNOSIS — Z23 Encounter for immunization: Secondary | ICD-10-CM

## 2012-09-29 DIAGNOSIS — Z01419 Encounter for gynecological examination (general) (routine) without abnormal findings: Secondary | ICD-10-CM | POA: Insufficient documentation

## 2012-09-29 DIAGNOSIS — Z1151 Encounter for screening for human papillomavirus (HPV): Secondary | ICD-10-CM | POA: Insufficient documentation

## 2012-09-29 DIAGNOSIS — Z87410 Personal history of cervical dysplasia: Secondary | ICD-10-CM

## 2012-09-29 LAB — CBC WITH DIFFERENTIAL/PLATELET
Basophils Absolute: 0 10*3/uL (ref 0.0–0.1)
Eosinophils Absolute: 0.2 10*3/uL (ref 0.0–0.7)
Eosinophils Relative: 3 % (ref 0–5)
HCT: 42.7 % (ref 36.0–46.0)
MCH: 30.7 pg (ref 26.0–34.0)
MCV: 90.5 fL (ref 78.0–100.0)
Neutro Abs: 4.1 10*3/uL (ref 1.7–7.7)
RBC: 4.72 MIL/uL (ref 3.87–5.11)
WBC: 6.1 10*3/uL (ref 4.0–10.5)

## 2012-09-29 LAB — COMPREHENSIVE METABOLIC PANEL
ALT: 19 U/L (ref 0–35)
AST: 28 U/L (ref 0–37)
BUN: 12 mg/dL (ref 6–23)
Calcium: 9 mg/dL (ref 8.4–10.5)
Chloride: 104 mEq/L (ref 96–112)
Creat: 0.81 mg/dL (ref 0.50–1.10)
Potassium: 4.3 mEq/L (ref 3.5–5.3)
Total Protein: 6.6 g/dL (ref 6.0–8.3)

## 2012-09-29 LAB — LIPID PANEL
HDL: 68 mg/dL (ref >39)
LDL Cholesterol: 19 mg/dL (ref 0–99)
VLDL: 77 mg/dL — ABNORMAL HIGH (ref 0–40)

## 2012-09-29 NOTE — Progress Notes (Signed)
Rebecca Howell 1975/07/25 [id number]   History:    37 y.o.  for annual gyn exam with no complaints today.patient uses Valtrex when necessary fever blisters. Patient is using barrier contraception. Patient reports normal menstrual cycles.patient also had a mammogram (diagnostic) in February of this year due to the fact that she had been complaining of tenderness in the upper outer quadrant the left breast. Her mammogram was otherwise negative. The patient has not received the Tdap vaccine as of yet. Review of her records indicated she had an abdominal myomectomy and appendectomy in 2003.  Patient has past history of CIN-1 and CIN-2  Past medical history,surgical history, family history and social history were all reviewed and documented in the EPIC chart.  Gynecologic History Patient's last menstrual period was 09/24/2012. Contraception: condoms Last Pap: 2013. Results were: normal Last mammogram: see above. Results were: see above  Obstetric History OB History   Grav Para Term Preterm Abortions TAB SAB Ect Mult Living   0                ROS: A ROS was performed and pertinent positives and negatives are included in the history.  GENERAL: No fevers or chills. HEENT: No change in vision, no earache, sore throat or sinus congestion. NECK: No pain or stiffness. CARDIOVASCULAR: No chest pain or pressure. No palpitations. PULMONARY: No shortness of breath, cough or wheeze. GASTROINTESTINAL: No abdominal pain, nausea, vomiting or diarrhea, melena or bright red blood per rectum. GENITOURINARY: No urinary frequency, urgency, hesitancy or dysuria. MUSCULOSKELETAL: No joint or muscle pain, no back pain, no recent trauma. DERMATOLOGIC: No rash, no itching, no lesions. ENDOCRINE: No polyuria, polydipsia, no heat or cold intolerance. No recent change in weight. HEMATOLOGICAL: No anemia or easy bruising or bleeding. NEUROLOGIC: No headache, seizures, numbness, tingling or weakness. PSYCHIATRIC: No depression, no  loss of interest in normal activity or change in sleep pattern.     Exam: chaperone present  BP 134/92  Ht 5' 3.5" (1.613 m)  Wt 126 lb (57.153 kg)  BMI 21.97 kg/m2  LMP 09/24/2012  Body mass index is 21.97 kg/(m^2).  General appearance : Well developed well nourished female. No acute distress HEENT: Neck supple, trachea midline, no carotid bruits, no thyroidmegaly Lungs: Clear to auscultation, no rhonchi or wheezes, or rib retractions  Heart: Regular rate and rhythm, no murmurs or gallops Breast:Examined in sitting and supine position were symmetrical in appearance, no palpable masses or tenderness,  no skin retraction, no nipple inversion, no nipple discharge, no skin discoloration, no axillary or supraclavicular lymphadenopathy Abdomen: no palpable masses or tenderness, no rebound or guarding Extremities: no edema or skin discoloration or tenderness  Pelvic:  Bartholin, Urethra, Skene Glands: Within normal limits             Vagina: No gross lesions or discharge  Cervix: No gross lesions or discharge  Uterus  anteverted, normal size, shape and consistency, non-tender and mobile  Adnexa  Without masses or tenderness  Anus and perineum  normal   Rectovaginal  normal sphincter tone without palpated masses or tenderness             Hemoccult none indicated     Assessment/Plan:  37 y.o. female for annual exam who was reminded on the importance of monthly breast exam. Her repeat blood pressure was 130/86. She received a Tdap vaccine today and the transformation was provided. Pap smear with HPV screen was done today. The new guidelines were discussed. The following labs were ordered:  CBC, comprehensive metabolic panel, urinalysis, TSH and fasting lipid profile.   Ok Edwards MD, 12:04 PM 09/29/2012

## 2012-09-29 NOTE — Patient Instructions (Addendum)

## 2012-09-29 NOTE — Addendum Note (Signed)
Addended by: Bertram Savin A on: 09/29/2012 12:21 PM   Modules accepted: Orders

## 2012-09-30 ENCOUNTER — Ambulatory Visit (INDEPENDENT_AMBULATORY_CARE_PROVIDER_SITE_OTHER): Payer: 59 | Admitting: Licensed Clinical Social Worker

## 2012-09-30 ENCOUNTER — Encounter: Payer: Self-pay | Admitting: Obstetrics and Gynecology

## 2012-09-30 ENCOUNTER — Other Ambulatory Visit: Payer: Self-pay | Admitting: Gynecology

## 2012-09-30 DIAGNOSIS — R3129 Other microscopic hematuria: Secondary | ICD-10-CM

## 2012-09-30 DIAGNOSIS — F321 Major depressive disorder, single episode, moderate: Secondary | ICD-10-CM

## 2012-09-30 LAB — URINALYSIS W MICROSCOPIC + REFLEX CULTURE
Casts: NONE SEEN
Crystals: NONE SEEN
Ketones, ur: NEGATIVE mg/dL
Nitrite: NEGATIVE
Specific Gravity, Urine: 1.022 (ref 1.005–1.030)
pH: 6.5 (ref 5.0–8.0)

## 2012-10-07 ENCOUNTER — Encounter: Payer: Self-pay | Admitting: Obstetrics and Gynecology

## 2012-10-12 ENCOUNTER — Other Ambulatory Visit: Payer: 59

## 2012-10-12 DIAGNOSIS — R3129 Other microscopic hematuria: Secondary | ICD-10-CM

## 2012-10-13 LAB — URINALYSIS W MICROSCOPIC + REFLEX CULTURE
Crystals: NONE SEEN
Ketones, ur: NEGATIVE mg/dL
Leukocytes, UA: NEGATIVE
Protein, ur: NEGATIVE mg/dL
Urobilinogen, UA: 0.2 mg/dL (ref 0.0–1.0)
pH: 6 (ref 5.0–8.0)

## 2012-10-14 ENCOUNTER — Ambulatory Visit (INDEPENDENT_AMBULATORY_CARE_PROVIDER_SITE_OTHER): Payer: 59 | Admitting: Licensed Clinical Social Worker

## 2012-10-14 DIAGNOSIS — F321 Major depressive disorder, single episode, moderate: Secondary | ICD-10-CM

## 2012-10-21 ENCOUNTER — Ambulatory Visit (INDEPENDENT_AMBULATORY_CARE_PROVIDER_SITE_OTHER): Payer: 59 | Admitting: Licensed Clinical Social Worker

## 2012-10-21 DIAGNOSIS — F321 Major depressive disorder, single episode, moderate: Secondary | ICD-10-CM

## 2012-10-28 ENCOUNTER — Ambulatory Visit (INDEPENDENT_AMBULATORY_CARE_PROVIDER_SITE_OTHER): Payer: 59 | Admitting: Licensed Clinical Social Worker

## 2012-10-28 DIAGNOSIS — F321 Major depressive disorder, single episode, moderate: Secondary | ICD-10-CM

## 2012-10-30 ENCOUNTER — Encounter: Payer: Self-pay | Admitting: Gynecology

## 2012-10-30 ENCOUNTER — Other Ambulatory Visit: Payer: Self-pay | Admitting: Obstetrics and Gynecology

## 2012-10-30 NOTE — Telephone Encounter (Signed)
Called into pharmacy

## 2012-11-11 ENCOUNTER — Ambulatory Visit (INDEPENDENT_AMBULATORY_CARE_PROVIDER_SITE_OTHER): Payer: 59 | Admitting: Licensed Clinical Social Worker

## 2012-11-11 DIAGNOSIS — F321 Major depressive disorder, single episode, moderate: Secondary | ICD-10-CM

## 2012-11-25 ENCOUNTER — Ambulatory Visit (INDEPENDENT_AMBULATORY_CARE_PROVIDER_SITE_OTHER): Payer: 59 | Admitting: Licensed Clinical Social Worker

## 2012-11-25 DIAGNOSIS — F321 Major depressive disorder, single episode, moderate: Secondary | ICD-10-CM

## 2012-12-09 ENCOUNTER — Ambulatory Visit (INDEPENDENT_AMBULATORY_CARE_PROVIDER_SITE_OTHER): Payer: 59 | Admitting: Licensed Clinical Social Worker

## 2012-12-09 DIAGNOSIS — F321 Major depressive disorder, single episode, moderate: Secondary | ICD-10-CM

## 2012-12-30 ENCOUNTER — Ambulatory Visit: Payer: 59 | Admitting: Licensed Clinical Social Worker

## 2013-01-11 ENCOUNTER — Ambulatory Visit (INDEPENDENT_AMBULATORY_CARE_PROVIDER_SITE_OTHER): Payer: 59 | Admitting: Licensed Clinical Social Worker

## 2013-01-11 DIAGNOSIS — F321 Major depressive disorder, single episode, moderate: Secondary | ICD-10-CM

## 2013-02-03 ENCOUNTER — Ambulatory Visit (INDEPENDENT_AMBULATORY_CARE_PROVIDER_SITE_OTHER): Payer: 59 | Admitting: Licensed Clinical Social Worker

## 2013-02-03 DIAGNOSIS — F321 Major depressive disorder, single episode, moderate: Secondary | ICD-10-CM

## 2013-02-15 ENCOUNTER — Ambulatory Visit (INDEPENDENT_AMBULATORY_CARE_PROVIDER_SITE_OTHER): Payer: 59 | Admitting: Licensed Clinical Social Worker

## 2013-02-15 DIAGNOSIS — F321 Major depressive disorder, single episode, moderate: Secondary | ICD-10-CM

## 2013-03-08 ENCOUNTER — Ambulatory Visit (INDEPENDENT_AMBULATORY_CARE_PROVIDER_SITE_OTHER): Payer: 59 | Admitting: Licensed Clinical Social Worker

## 2013-03-08 DIAGNOSIS — F321 Major depressive disorder, single episode, moderate: Secondary | ICD-10-CM

## 2013-03-22 ENCOUNTER — Ambulatory Visit (INDEPENDENT_AMBULATORY_CARE_PROVIDER_SITE_OTHER): Payer: 59 | Admitting: Licensed Clinical Social Worker

## 2013-03-22 DIAGNOSIS — F321 Major depressive disorder, single episode, moderate: Secondary | ICD-10-CM

## 2013-04-05 ENCOUNTER — Ambulatory Visit (INDEPENDENT_AMBULATORY_CARE_PROVIDER_SITE_OTHER): Payer: 59 | Admitting: Licensed Clinical Social Worker

## 2013-04-05 DIAGNOSIS — F321 Major depressive disorder, single episode, moderate: Secondary | ICD-10-CM

## 2013-04-19 ENCOUNTER — Ambulatory Visit (INDEPENDENT_AMBULATORY_CARE_PROVIDER_SITE_OTHER): Payer: 59 | Admitting: Licensed Clinical Social Worker

## 2013-04-19 ENCOUNTER — Other Ambulatory Visit: Payer: Self-pay | Admitting: Gynecology

## 2013-04-19 DIAGNOSIS — F321 Major depressive disorder, single episode, moderate: Secondary | ICD-10-CM

## 2013-04-19 NOTE — Telephone Encounter (Signed)
Called into pharmacy

## 2013-05-10 ENCOUNTER — Ambulatory Visit (INDEPENDENT_AMBULATORY_CARE_PROVIDER_SITE_OTHER): Payer: 59 | Admitting: Licensed Clinical Social Worker

## 2013-05-10 DIAGNOSIS — F321 Major depressive disorder, single episode, moderate: Secondary | ICD-10-CM

## 2013-05-19 ENCOUNTER — Encounter: Payer: Self-pay | Admitting: Gynecology

## 2013-05-19 ENCOUNTER — Ambulatory Visit (INDEPENDENT_AMBULATORY_CARE_PROVIDER_SITE_OTHER): Payer: 59 | Admitting: Gynecology

## 2013-05-19 DIAGNOSIS — R300 Dysuria: Secondary | ICD-10-CM

## 2013-05-19 DIAGNOSIS — IMO0001 Reserved for inherently not codable concepts without codable children: Secondary | ICD-10-CM

## 2013-05-19 DIAGNOSIS — N390 Urinary tract infection, site not specified: Secondary | ICD-10-CM

## 2013-05-19 DIAGNOSIS — R350 Frequency of micturition: Secondary | ICD-10-CM

## 2013-05-19 LAB — URINALYSIS W MICROSCOPIC + REFLEX CULTURE
Bilirubin Urine: NEGATIVE
CASTS: NONE SEEN
Crystals: NONE SEEN
Glucose, UA: NEGATIVE mg/dL
HGB URINE DIPSTICK: NEGATIVE
NITRITE: NEGATIVE
PH: 7 (ref 5.0–8.0)
Specific Gravity, Urine: 1.02 (ref 1.005–1.030)
Urobilinogen, UA: 0.2 mg/dL (ref 0.0–1.0)

## 2013-05-19 MED ORDER — CIPROFLOXACIN HCL 250 MG PO TABS: 250.0000 mg | ORAL_TABLET | Freq: Two times a day (BID) | ORAL | Status: DC

## 2013-05-19 MED ORDER — FLUCONAZOLE 100 MG PO TABS: ORAL_TABLET | ORAL | Status: DC

## 2013-05-19 NOTE — Progress Notes (Signed)
   The patient presented to the office today complaining of urinary frequency and dysuria the past few days. Patient denies any fever, chills, nausea, vomiting or any back pain. She's having normal menstrual cycles using condoms for contraception.  Review of her records indicated she had a urinary tract infection July 2014 whereby the organism was Escherichia coli identified.  Abdominal exam demonstrated suprapubic tenderness. Patient denies any vaginal discharge no pelvic exam was done.  Urinalysis: 11-20 WBC, few bacteria  Assessment/plan: Early cystitis. Patient will be started on Cipro 250 mg twice a day for 3 days. She was given sample of Uribell to take one by mouth 4 times a day for 2 days as an anti-spasmodic agent. She was given prescription of Diflucan since she states that when she takes antibiotics she developed yeast infections.

## 2013-05-19 NOTE — Patient Instructions (Signed)
Urinary Tract Infection  Urinary tract infections (UTIs) can develop anywhere along your urinary tract. Your urinary tract is your body's drainage system for removing wastes and extra water. Your urinary tract includes two kidneys, two ureters, a bladder, and a urethra. Your kidneys are a pair of bean-shaped organs. Each kidney is about the size of your fist. They are located below your ribs, one on each side of your spine.  CAUSES  Infections are caused by microbes, which are microscopic organisms, including fungi, viruses, and bacteria. These organisms are so small that they can only be seen through a microscope. Bacteria are the microbes that most commonly cause UTIs.  SYMPTOMS   Symptoms of UTIs may vary by age and gender of the patient and by the location of the infection. Symptoms in young women typically include a frequent and intense urge to urinate and a painful, burning feeling in the bladder or urethra during urination. Older women and men are more likely to be tired, shaky, and weak and have muscle aches and abdominal pain. A fever may mean the infection is in your kidneys. Other symptoms of a kidney infection include pain in your back or sides below the ribs, nausea, and vomiting.  DIAGNOSIS  To diagnose a UTI, your caregiver will ask you about your symptoms. Your caregiver also will ask to provide a urine sample. The urine sample will be tested for bacteria and white blood cells. White blood cells are made by your body to help fight infection.  TREATMENT   Typically, UTIs can be treated with medication. Because most UTIs are caused by a bacterial infection, they usually can be treated with the use of antibiotics. The choice of antibiotic and length of treatment depend on your symptoms and the type of bacteria causing your infection.  HOME CARE INSTRUCTIONS   If you were prescribed antibiotics, take them exactly as your caregiver instructs you. Finish the medication even if you feel better after you  have only taken some of the medication.   Drink enough water and fluids to keep your urine clear or pale yellow.   Avoid caffeine, tea, and carbonated beverages. They tend to irritate your bladder.   Empty your bladder often. Avoid holding urine for long periods of time.   Empty your bladder before and after sexual intercourse.   After a bowel movement, women should cleanse from front to back. Use each tissue only once.  SEEK MEDICAL CARE IF:    You have back pain.   You develop a fever.   Your symptoms do not begin to resolve within 3 days.  SEEK IMMEDIATE MEDICAL CARE IF:    You have severe back pain or lower abdominal pain.   You develop chills.   You have nausea or vomiting.   You have continued burning or discomfort with urination.  MAKE SURE YOU:    Understand these instructions.   Will watch your condition.   Will get help right away if you are not doing well or get worse.  Document Released: 11/21/2004 Document Revised: 08/13/2011 Document Reviewed: 03/22/2011  Exit[employer] Patient Information 2014 [employer].

## 2013-05-20 ENCOUNTER — Ambulatory Visit: Payer: 59 | Admitting: Gynecology

## 2013-05-21 LAB — URINE CULTURE

## 2013-05-24 ENCOUNTER — Ambulatory Visit (INDEPENDENT_AMBULATORY_CARE_PROVIDER_SITE_OTHER): Payer: 59 | Admitting: Licensed Clinical Social Worker

## 2013-05-24 DIAGNOSIS — F321 Major depressive disorder, single episode, moderate: Secondary | ICD-10-CM

## 2013-06-16 ENCOUNTER — Ambulatory Visit (INDEPENDENT_AMBULATORY_CARE_PROVIDER_SITE_OTHER): Payer: 59 | Admitting: Licensed Clinical Social Worker

## 2013-06-16 DIAGNOSIS — F321 Major depressive disorder, single episode, moderate: Secondary | ICD-10-CM

## 2013-07-05 ENCOUNTER — Ambulatory Visit (INDEPENDENT_AMBULATORY_CARE_PROVIDER_SITE_OTHER): Payer: 59 | Admitting: Licensed Clinical Social Worker

## 2013-07-05 DIAGNOSIS — F321 Major depressive disorder, single episode, moderate: Secondary | ICD-10-CM

## 2013-07-26 ENCOUNTER — Ambulatory Visit (INDEPENDENT_AMBULATORY_CARE_PROVIDER_SITE_OTHER): Payer: 59 | Admitting: Licensed Clinical Social Worker

## 2013-07-26 DIAGNOSIS — F321 Major depressive disorder, single episode, moderate: Secondary | ICD-10-CM

## 2013-08-16 ENCOUNTER — Ambulatory Visit (INDEPENDENT_AMBULATORY_CARE_PROVIDER_SITE_OTHER): Payer: 59 | Admitting: Licensed Clinical Social Worker

## 2013-08-16 DIAGNOSIS — F321 Major depressive disorder, single episode, moderate: Secondary | ICD-10-CM

## 2013-09-06 ENCOUNTER — Ambulatory Visit (INDEPENDENT_AMBULATORY_CARE_PROVIDER_SITE_OTHER): Payer: 59 | Admitting: Licensed Clinical Social Worker

## 2013-09-06 DIAGNOSIS — F321 Major depressive disorder, single episode, moderate: Secondary | ICD-10-CM

## 2013-09-27 ENCOUNTER — Ambulatory Visit: Payer: 59 | Admitting: Licensed Clinical Social Worker

## 2013-11-26 ENCOUNTER — Other Ambulatory Visit: Payer: Self-pay | Admitting: Gynecology

## 2013-12-09 ENCOUNTER — Other Ambulatory Visit: Payer: Self-pay

## 2013-12-09 MED ORDER — ALPRAZOLAM 0.5 MG PO TABS: ORAL_TABLET | ORAL | Status: DC

## 2013-12-09 NOTE — Telephone Encounter (Signed)
Rx sent. appt already scheduled per my note.

## 2013-12-09 NOTE — Telephone Encounter (Signed)
Patient is overdue for her annual exam. Make sure she contacts office to schedule

## 2013-12-09 NOTE — Telephone Encounter (Signed)
Patient called because message was left at pharmacy that she needed to contact provider. She had requested Xanax refill but last CE was 09/28/12 and she had not scheduled. I relayed this to her and explained need for yearly exam. She made appointment with you for 01/04/14.  She wonders if you will not refill her Xanax for her?

## 2014-01-04 ENCOUNTER — Encounter: Payer: Self-pay | Admitting: Gynecology

## 2014-01-04 ENCOUNTER — Ambulatory Visit (INDEPENDENT_AMBULATORY_CARE_PROVIDER_SITE_OTHER): Payer: 59 | Admitting: Gynecology

## 2014-01-04 ENCOUNTER — Other Ambulatory Visit (HOSPITAL_COMMUNITY)
Admission: RE | Admit: 2014-01-04 | Discharge: 2014-01-04 | Disposition: A | Discharge status: Home / Self Care | Payer: 59 | Source: Ambulatory Visit | Attending: Gynecology | Admitting: Gynecology

## 2014-01-04 VITALS — BP 128/84 | Ht 63.5 in | Wt 129.3 lb

## 2014-01-04 DIAGNOSIS — Z01419 Encounter for gynecological examination (general) (routine) without abnormal findings: Secondary | ICD-10-CM

## 2014-01-04 DIAGNOSIS — Z87410 Personal history of cervical dysplasia: Secondary | ICD-10-CM

## 2014-01-04 NOTE — Progress Notes (Signed)
Rebecca Howell [birth year] [id number]   History:    38 y.o.  for annual gyn exam with no complaints today. Patient is using condoms for contraception and is still undecided about trying to have her first child. She is under discussion with her husband. . She reports normal menstrual cycles. She received the T Pap in 2014.Review of her records indicated she had an abdominal myomectomy and appendectomy in 2003. Marland Kitchen  Patient has past history of CIN-1 and CIN-2  Gynecologic History Patient's last menstrual period was 12/26/2013. Contraception: condoms Last Pap: 2014. Results were: normal Last mammogram: 2014. Results were: normal  Obstetric History OB History  Gravida Para Term Preterm AB SAB TAB Ectopic Multiple Living  0                  ROS: A ROS was performed and pertinent positives and negatives are included in the history.  GENERAL: No fevers or chills. HEENT: No change in vision, no earache, sore throat or sinus congestion. NECK: No pain or stiffness. CARDIOVASCULAR: No chest pain or pressure. No palpitations. PULMONARY: No shortness of breath, cough or wheeze. GASTROINTESTINAL: No abdominal pain, nausea, vomiting or diarrhea, melena or bright red blood per rectum. GENITOURINARY: No urinary frequency, urgency, hesitancy or dysuria. MUSCULOSKELETAL: No joint or muscle pain, no back pain, no recent trauma. DERMATOLOGIC: No rash, no itching, no lesions. ENDOCRINE: No polyuria, polydipsia, no heat or cold intolerance. No recent change in weight. HEMATOLOGICAL: No anemia or easy bruising or bleeding. NEUROLOGIC: No headache, seizures, numbness, tingling or weakness. PSYCHIATRIC: No depression, no loss of interest in normal activity or change in sleep pattern.     Exam: chaperone present  BP 128/84 mmHg  Ht 5' 3.5" (1.613 m)  Wt 129 lb 4.8 oz (58.65 kg)  BMI 22.54 kg/m2  LMP 12/26/2013  Body mass index is 22.54 kg/(m^2).  General appearance : Well developed well nourished female. No  acute distress HEENT: Neck supple, trachea midline, no carotid bruits, no thyroidmegaly Lungs: Clear to auscultation, no rhonchi or wheezes, or rib retractions  Heart: Regular rate and rhythm, no murmurs or gallops Breast:Examined in sitting and supine position were symmetrical in appearance, no palpable masses or tenderness,  no skin retraction, no nipple inversion, no nipple discharge, no skin discoloration, no axillary or supraclavicular lymphadenopathy Abdomen: no palpable masses or tenderness, no rebound or guarding Extremities: no edema or skin discoloration or tenderness  Pelvic:  Bartholin, Urethra, Skene Glands: Within normal limits             Vagina: No gross lesions or discharge  Cervix: No gross lesions or discharge  Uterus  anteverted, normal size, shape and consistency, non-tender and mobile  Adnexa  Without masses or tenderness  Anus and perineum  normal   Rectovaginal  normal sphincter tone without palpated masses or tenderness             Hemoccult not indicated     Assessment/Plan:  38 y.o. female for annual exam who was reminded that she is not 38 years of age set her and her husband should be planning on trying to have a child because if she waits any longer the risk of infertility increases and the risk of maternal complications due to advanced maternal age to include fetal aneuploidy as well as maternal hypertension, diabetes, and premature delivery. She was reminded to take prenatal vitamins daily. Her Pap smear was done today. We discussed importance of monthly self breast examination. Patient declined flu  vaccine.   Terrance Mass MD, 12:19 PM 01/04/2014

## 2014-01-05 LAB — CYTOLOGY - PAP

## 2014-01-26 ENCOUNTER — Other Ambulatory Visit: Payer: Self-pay | Admitting: Gynecology

## 2014-01-26 NOTE — Telephone Encounter (Signed)
CALLED INTO PHARMACY

## 2014-06-13 ENCOUNTER — Other Ambulatory Visit: Payer: Self-pay | Admitting: Gynecology

## 2014-06-13 NOTE — Telephone Encounter (Signed)
Rx called in [facility] 

## 2014-11-13 ENCOUNTER — Emergency Department (HOSPITAL_COMMUNITY): Payer: Commercial Managed Care - HMO

## 2014-11-13 ENCOUNTER — Inpatient Hospital Stay (HOSPITAL_COMMUNITY)
Admission: EM | Admit: 2014-11-13 | Discharge: 2014-11-16 | DRG: 087 | Disposition: A | Discharge status: Home / Self Care | Payer: Commercial Managed Care - HMO | Attending: Neurosurgery | Admitting: Neurosurgery

## 2014-11-13 ENCOUNTER — Encounter (HOSPITAL_COMMUNITY): Payer: Self-pay | Admitting: *Deleted

## 2014-11-13 DIAGNOSIS — S02119A Unspecified fracture of occiput, initial encounter for closed fracture: Principal | ICD-10-CM | POA: Diagnosis present

## 2014-11-13 DIAGNOSIS — W19XXXA Unspecified fall, initial encounter: Secondary | ICD-10-CM | POA: Diagnosis present

## 2014-11-13 DIAGNOSIS — S06339A Contusion and laceration of cerebrum, unspecified, with loss of consciousness of unspecified duration, initial encounter: Secondary | ICD-10-CM | POA: Diagnosis present

## 2014-11-13 DIAGNOSIS — S065X0A Traumatic subdural hemorrhage without loss of consciousness, initial encounter: Secondary | ICD-10-CM | POA: Diagnosis present

## 2014-11-13 DIAGNOSIS — S065X9A Traumatic subdural hemorrhage with loss of consciousness of unspecified duration, initial encounter: Secondary | ICD-10-CM

## 2014-11-13 DIAGNOSIS — S0633AA Contusion and laceration of cerebrum, unspecified, with loss of consciousness status unknown, initial encounter: Secondary | ICD-10-CM | POA: Diagnosis present

## 2014-11-13 DIAGNOSIS — F10129 Alcohol abuse with intoxication, unspecified: Secondary | ICD-10-CM | POA: Diagnosis present

## 2014-11-13 DIAGNOSIS — Y92009 Unspecified place in unspecified non-institutional (private) residence as the place of occurrence of the external cause: Secondary | ICD-10-CM | POA: Diagnosis not present

## 2014-11-13 DIAGNOSIS — R40241 Glasgow coma scale score 13-15: Secondary | ICD-10-CM | POA: Diagnosis present

## 2014-11-13 DIAGNOSIS — Z79899 Other long term (current) drug therapy: Secondary | ICD-10-CM | POA: Diagnosis not present

## 2014-11-13 DIAGNOSIS — S065XAA Traumatic subdural hemorrhage with loss of consciousness status unknown, initial encounter: Secondary | ICD-10-CM

## 2014-11-13 DIAGNOSIS — R51 Headache: Secondary | ICD-10-CM | POA: Diagnosis not present

## 2014-11-13 DIAGNOSIS — I619 Nontraumatic intracerebral hemorrhage, unspecified: Secondary | ICD-10-CM | POA: Diagnosis present

## 2014-11-13 DIAGNOSIS — S06340A Traumatic hemorrhage of right cerebrum without loss of consciousness, initial encounter: Secondary | ICD-10-CM

## 2014-11-13 LAB — BASIC METABOLIC PANEL
Anion gap: 13 (ref 5–15)
BUN: 5 mg/dL — AB (ref 6–20)
CALCIUM: 8.6 mg/dL — AB (ref 8.9–10.3)
CHLORIDE: 99 mmol/L — AB (ref 101–111)
CO2: 21 mmol/L — AB (ref 22–32)
CREATININE: 0.66 mg/dL (ref 0.44–1.00)
GFR calc Af Amer: >60 mL/min (ref >60)
GFR calc non Af Amer: >60 mL/min (ref >60)
GLUCOSE: 146 mg/dL — AB (ref 65–99)
Potassium: 4.1 mmol/L (ref 3.5–5.1)
Sodium: 133 mmol/L — ABNORMAL LOW (ref 135–145)

## 2014-11-13 LAB — CBC
HEMATOCRIT: 37.8 % (ref 36.0–46.0)
HEMOGLOBIN: 13.1 g/dL (ref 12.0–15.0)
MCH: 31.3 pg (ref 26.0–34.0)
MCHC: 34.7 g/dL (ref 30.0–36.0)
MCV: 90.2 fL (ref 78.0–100.0)
Platelets: 216 10*3/uL (ref 150–400)
RBC: 4.19 MIL/uL (ref 3.87–5.11)
RDW: 12.4 % (ref 11.5–15.5)
WBC: 13.8 10*3/uL — ABNORMAL HIGH (ref 4.0–10.5)

## 2014-11-13 LAB — PROTIME-INR
INR: 1.05 (ref 0.00–1.49)
Prothrombin Time: 13.9 seconds (ref 11.6–15.2)

## 2014-11-13 MED ORDER — HYDROXYZINE HCL 50 MG/ML IM SOLN
50.0000 mg | INTRAMUSCULAR | Status: DC | PRN
  Filled 2014-11-13: qty 1

## 2014-11-13 MED ORDER — ONDANSETRON HCL 4 MG PO TABS
4.0000 mg | ORAL_TABLET | Freq: Four times a day (QID) | ORAL | Status: DC | PRN
  Administered 2014-11-14: 4 mg via ORAL
  Filled 2014-11-13: qty 1

## 2014-11-13 MED ORDER — FENTANYL CITRATE (PF) 100 MCG/2ML IJ SOLN
50.0000 ug | Freq: Once | INTRAMUSCULAR | Status: AC
  Administered 2014-11-13: 50 ug via INTRAVENOUS
  Filled 2014-11-13: qty 2

## 2014-11-13 MED ORDER — MORPHINE SULFATE (PF) 2 MG/ML IV SOLN
1.0000 mg | INTRAVENOUS | Status: DC | PRN
  Administered 2014-11-13 – 2014-11-16 (×18): 2 mg via INTRAVENOUS
  Filled 2014-11-13 (×18): qty 1

## 2014-11-13 MED ORDER — HYDROXYZINE HCL 25 MG PO TABS: 50.0000 mg | ORAL_TABLET | ORAL | Status: DC | PRN

## 2014-11-13 MED ORDER — ONDANSETRON HCL 4 MG/2ML IJ SOLN
4.0000 mg | Freq: Once | INTRAMUSCULAR | Status: AC
  Administered 2014-11-13: 4 mg via INTRAVENOUS
  Filled 2014-11-13: qty 2

## 2014-11-13 MED ORDER — ONDANSETRON HCL 4 MG/2ML IJ SOLN
4.0000 mg | Freq: Four times a day (QID) | INTRAMUSCULAR | Status: DC | PRN
  Administered 2014-11-13 – 2014-11-16 (×8): 4 mg via INTRAVENOUS
  Filled 2014-11-13 (×8): qty 2

## 2014-11-13 MED ORDER — PROMETHAZINE HCL 25 MG/ML IJ SOLN
25.0000 mg | Freq: Once | INTRAMUSCULAR | Status: AC
  Administered 2014-11-13: 25 mg via INTRAVENOUS
  Filled 2014-11-13: qty 1

## 2014-11-13 MED ORDER — SODIUM CHLORIDE 0.9 % IV SOLN
INTRAVENOUS | Status: DC
  Administered 2014-11-13 – 2014-11-15 (×5): via INTRAVENOUS
  Filled 2014-11-13 (×11): qty 1000

## 2014-11-13 MED ORDER — FENTANYL CITRATE (PF) 100 MCG/2ML IJ SOLN
100.0000 ug | Freq: Once | INTRAMUSCULAR | Status: AC
  Administered 2014-11-13: 100 ug via INTRAVENOUS
  Filled 2014-11-13: qty 2

## 2014-11-13 NOTE — ED Provider Notes (Signed)
CSN: [id number]     Arrival date & time 11/13/14  1830 History   First MD Initiated Contact with Patient 11/13/14 1840     Chief Complaint  Patient presents with  . Head Injury  . Headache     (Consider location/radiation/quality/duration/timing/severity/associated sxs/prior Treatment) HPI Comments: 39 year old female, history of no significant medical problems, no anticoagulation, presents after multiple head injuries last night. According to onlookers she had fallen several times while she was heavily intoxicated with alcohol, she does not remember this, she awoke this morning with severe headache, multiple episodes of vomiting, bruising over both of her upper eyelids. She denies seizure activity, she has been generally weak but able to ambulate and speak and has normal mental status. The symptoms are persistent, severe, worse with standing, associated with vertigo.  Patient is a 39 y.o. female presenting with head injury and headaches. The history is provided by the patient.  Head Injury Associated symptoms: headache   Headache   Past Medical History  Diagnosis Date  . CIN I (cervical intraepithelial neoplasia I)   . CIN II (cervical intraepithelial neoplasia II)   . Fever blister   . Fibroid   . Anxiety    Past Surgical History  Procedure Laterality Date  . Myomectomy  2003  . Appendectomy  2003   Family History  Problem Relation Age of Onset  . Pancreatic cancer Maternal Grandmother   . Hypertension Maternal Grandmother   . Hypertension Maternal Grandfather   . Breast cancer Paternal Grandmother     Age 74's  . Hypertension Paternal Grandmother   . Colon cancer Paternal Grandfather   . Hypertension Paternal Grandfather    Social History  Substance Use Topics  . Smoking status: Former Research scientist (life sciences)  . Smokeless tobacco: None  . Alcohol Use: 8.4 oz/week    14 Standard drinks or equivalent per week   OB History    Gravida Para Term Preterm AB TAB SAB Ectopic Multiple  Living   0              Review of Systems  Neurological: Positive for headaches.  All other systems reviewed and are negative.     Allergies  Review of patient's allergies indicates no known allergies.  Home Medications   Prior to Admission medications   Medication Sig Start Date End Date Taking? Authorizing Provider  ALPRAZolam Duanne Moron) 0.5 MG tablet TAKE 1 TABLET BY MOUTH DAILY AS NEEDED 06/13/14  Yes Terrance Mass, MD  ibuprofen (ADVIL,MOTRIN) 200 MG tablet Take 400-600 mg by mouth every 6 (six) hours as needed for moderate pain.   Yes Historical Provider, MD  ciprofloxacin (CIPRO) 250 MG tablet Take 1 tablet (250 mg total) by mouth 2 (two) times daily. For 3 days Patient not taking: Reported on 11/13/2014 05/19/13   Terrance Mass, MD  fluconazole (DIFLUCAN) 100 MG tablet Take one tablet today may repeat in 2 days Patient not taking: Reported on 11/13/2014 05/19/13   Terrance Mass, MD  valACYclovir (VALTREX) 500 MG tablet take 1 tablet by mouth once daily Patient not taking: Reported on 11/13/2014 07/09/11   Bennetta Laos, MD   BP 139/75 mmHg  Pulse 76  Temp(Src) 98.7 F (37.1 C) (Oral)  Resp 20  SpO2 100%  LMP 11/08/2014 Physical Exam  Constitutional: She appears well-developed and well-nourished. No distress.  HENT:  Head: Normocephalic.  Mouth/Throat: Oropharynx is clear and moist. No oropharyngeal exudate.  No hemotympanum or malocclusion, no battle sign, there is bilateral raccoon  eyes present. Tenderness of the posterior scalp, no obvious hematoma, no laceration  Eyes: Conjunctivae and EOM are normal. Pupils are equal, round, and reactive to light. Right eye exhibits no discharge. Left eye exhibits no discharge. No scleral icterus.  Mild nystagmus bilaterally  Neck: Normal range of motion. Neck supple. No JVD present. No thyromegaly present.  Cardiovascular: Normal rate, regular rhythm, normal heart sounds and intact distal pulses.  Exam reveals no gallop and  no friction rub.   No murmur heard. Pulmonary/Chest: Effort normal and breath sounds normal. No respiratory distress. She has no wheezes. She has no rales.  Abdominal: Soft. Bowel sounds are normal. She exhibits no distension and no mass. There is no tenderness.  Musculoskeletal: Normal range of motion. She exhibits no edema or tenderness.  No tenderness over the extremities, supple joints, very soft compartments  Lymphadenopathy:    She has no cervical adenopathy.  Neurological: She is alert. Coordination normal.  Follows commands without difficulty, normal coordination and speech and cranial nerves III through XII  Skin: Skin is warm and dry. No rash noted. No erythema.  Psychiatric: She has a normal mood and affect. Her behavior is normal.  Nursing note and vitals reviewed.   ED Course  Procedures (including critical care time) Labs Review Labs Reviewed  CBC - Abnormal; Notable for the following:    WBC 13.8 (*)    All other components within normal limits  BASIC METABOLIC PANEL - Abnormal; Notable for the following:    Sodium 133 (*)    Chloride 99 (*)    CO2 21 (*)    Glucose, Bld 146 (*)    BUN 5 (*)    Calcium 8.6 (*)    All other components within normal limits  PROTIME-INR    Imaging Review Ct Head Wo Contrast  11/13/2014   CLINICAL DATA:  [place] last night while drinking but does not remember how, ecchymosis at both eyes, severe headache, nausea, former smoker  EXAM: CT HEAD WITHOUT CONTRAST  CT CERVICAL SPINE WITHOUT CONTRAST  TECHNIQUE: Multidetector CT imaging of the head and cervical spine was performed following the standard protocol without intravenous contrast. Multiplanar CT image reconstructions of the cervical spine were also generated.  COMPARISON:  None  FINDINGS: CT HEAD FINDINGS  Normal ventricular morphology.  Foci of high attenuation are identified within the RIGHT frontal lobe compatible with intraparenchymal hemorrhage, including regions it measured 3.0 x  1.6 cm image 12 and 3.5 x 2.0 cm image 9.  Small subdural hematoma is identified in the RIGHT frontoparietal region up to 3 mm thick.  RIGHT frontal subdural minimally extends onto the anterior aspect of the falx.  Larger area of hemorrhage at the inferior aspect of the RIGHT anterior cranial fossa is identified 4.2 x 2.8 cm favor additional subdural hematoma.  Minimal RIGHT to LEFT midline shift 3 mm.  No additional areas of intraparenchymal hemorrhage identified.  Several small foci of suspected minimal subarachnoid blood in the RIGHT frontal region.  Edema is seen surrounding the areas of intraparenchymal hemorrhage in RIGHT frontal lobe.  Remaining brain parenchyma normal in appearance.  No evidence of mass or infarction.  Frontal scalp hematoma.  Small nondisplaced RIGHT occipital skull fracture.  Visualized paranasal sinuses and mastoid air cells clear.  CT CERVICAL SPINE FINDINGS  Lung apices clear.  Visualized skullbase intact.  Prevertebral soft tissues normal thickness.  Vertebral body and disc space heights maintained.  No acute cervical spine fracture, subluxation or bone destruction.  Mild  facet degenerative changes inferior cervical spine.  Soft tissues unremarkable.  IMPRESSION: RIGHT frontal subdural hematoma up to 3 mm thick, extending laterally into RIGHT temporal region, minimally onto anterior falx, and probably onto the floor of RIGHT anterior cranial fossa.  Large intra cerebral hemorrhagic contusions at the RIGHT frontal lobe with surrounding edema but only minimal RIGHT to LEFT midline shift.  Nondisplaced occipital fracture.  No acute cervical spine abnormalities.  Critical Value/emergent results were called by telephone at the time of interpretation on 11/13/2014 at 1956 hr to Dr. Noemi Chapel , who verbally acknowledged these results.   Electronically Signed   By: Lavonia Dana M.D.   On: 11/13/2014 20:03   Ct Cervical Spine Wo Contrast  11/13/2014   CLINICAL DATA:  [place] last night while  drinking but does not remember how, ecchymosis at both eyes, severe headache, nausea, former smoker  EXAM: CT HEAD WITHOUT CONTRAST  CT CERVICAL SPINE WITHOUT CONTRAST  TECHNIQUE: Multidetector CT imaging of the head and cervical spine was performed following the standard protocol without intravenous contrast. Multiplanar CT image reconstructions of the cervical spine were also generated.  COMPARISON:  None  FINDINGS: CT HEAD FINDINGS  Normal ventricular morphology.  Foci of high attenuation are identified within the RIGHT frontal lobe compatible with intraparenchymal hemorrhage, including regions it measured 3.0 x 1.6 cm image 12 and 3.5 x 2.0 cm image 9.  Small subdural hematoma is identified in the RIGHT frontoparietal region up to 3 mm thick.  RIGHT frontal subdural minimally extends onto the anterior aspect of the falx.  Larger area of hemorrhage at the inferior aspect of the RIGHT anterior cranial fossa is identified 4.2 x 2.8 cm favor additional subdural hematoma.  Minimal RIGHT to LEFT midline shift 3 mm.  No additional areas of intraparenchymal hemorrhage identified.  Several small foci of suspected minimal subarachnoid blood in the RIGHT frontal region.  Edema is seen surrounding the areas of intraparenchymal hemorrhage in RIGHT frontal lobe.  Remaining brain parenchyma normal in appearance.  No evidence of mass or infarction.  Frontal scalp hematoma.  Small nondisplaced RIGHT occipital skull fracture.  Visualized paranasal sinuses and mastoid air cells clear.  CT CERVICAL SPINE FINDINGS  Lung apices clear.  Visualized skullbase intact.  Prevertebral soft tissues normal thickness.  Vertebral body and disc space heights maintained.  No acute cervical spine fracture, subluxation or bone destruction.  Mild facet degenerative changes inferior cervical spine.  Soft tissues unremarkable.  IMPRESSION: RIGHT frontal subdural hematoma up to 3 mm thick, extending laterally into RIGHT temporal region, minimally onto  anterior falx, and probably onto the floor of RIGHT anterior cranial fossa.  Large intra cerebral hemorrhagic contusions at the RIGHT frontal lobe with surrounding edema but only minimal RIGHT to LEFT midline shift.  Nondisplaced occipital fracture.  No acute cervical spine abnormalities.  Critical Value/emergent results were called by telephone at the time of interpretation on 11/13/2014 at 1956 hr to Dr. Noemi Chapel , who verbally acknowledged these results.   Electronically Signed   By: Lavonia Dana M.D.   On: 11/13/2014 20:03   I have personally reviewed and evaluated these images and lab results as part of my medical decision-making.   EKG Interpretation None      MDM   Final diagnoses:  Traumatic hemorrhage of right cerebrum, initial encounter  SDH (subdural hematoma)    The patient has evidence of basilar skull fracture, will obtain CT scan imaging, cervical spine imaging, labs, medications including fentanyl and  Zofran.  D/w Dr. Sherwood Gambler who accepts to the [facility] ICU in transfer - he will cmoe to ED to see pt.  Multiple meds given, IVF held, no decline in mental status.  Critically ill  CRITICAL CARE Performed by: Johnna Acosta Total critical care time: 35 Critical care time was exclusive of separately billable procedures and treating other patients. Critical care was necessary to treat or prevent imminent or life-threatening deterioration. Critical care was time spent personally by me on the following activities: development of treatment plan with patient and/or surrogate as well as nursing, discussions with consultants, evaluation of patient's response to treatment, examination of patient, obtaining history from patient or surrogate, ordering and performing treatments and interventions, ordering and review of laboratory studies, ordering and review of radiographic studies, pulse oximetry and re-evaluation of patient's condition.   Noemi Chapel, MD 11/13/14 [id number]

## 2014-11-13 NOTE — H&P (Signed)
Rebecca Howell is an 39 y.o. right-handed white female.  HPI: Patient presented to the [facility] emergency room today because of throbbing frontal headache, vomiting, inability to keep liquids or solids down, etc.  Last night she had been drinking significantly with friends, and apparently had fallen at least once, but possibly more than once, including when she was getting out of the vehicle when she was returning home. Patient himself does not have recall of last night, her extent of intoxication, her falls or hitting her head. This morning she felt lousy, had vomited, and had a throbbing frontal headache. She also describes some soreness in the back of her head. She's had photophobia. She's been evaluated by Dr. Noemi Chapel, the emergency room physician. He obtained a CT of the brain without contrast which revealed a large right frontal hemorrhagic cerebral contusion, with small amounts of adjacent subdural hematoma. Neurosurgical consultation requested for further evaluation. The patient is now admitted for supportive care, and is to be transferred from the [facility] emergency room to the neurosurgical ICU at [facility].   Past Medical History: Patient denies any history of hypertension, myocardial infarction, heart disease, stroke, diabetes, cancer, peptic ulcers, lung disease.  Past Medical History  Diagnosis Date  . CIN I (cervical intraepithelial neoplasia I)   . CIN II (cervical intraepithelial neoplasia II)   . Fever blister   . Fibroid   . Anxiety     Past Surgical History:  Past Surgical History  Procedure Laterality Date  . Myomectomy  2003  . Appendectomy  2003    Family History:  She reports that her mother is 63 and healthy, and her father 100 and healthy.  Family History  Problem Relation Age of Onset  . Pancreatic cancer Maternal Grandmother   . Hypertension Maternal Grandmother   . Hypertension Maternal Grandfather   .  Breast cancer Paternal Grandmother     Age 19's  . Hypertension Paternal Grandmother   . Colon cancer Paternal Grandfather   . Hypertension Paternal Grandfather     Social History: Patient is married, she works as a Cabin crew, she has no children. She denies smoking cigarettes. She reports that she drinks 2-3 glasses of wine most evenings, but more on weekends. She denies use of recreational drugs.3  Allergies: No Known Allergies  Medications: I have reviewed the patient's current medications.  ROS:  Notable for those difficulties described inner history of present illness and past history, but is otherwise unremarkable  Physical Examination: Patient well-developed well-nourished white female with obvious photophobia (covering her eyes unless requested not to do so), but in no acute distress. Blood pressure 139/75, pulse 76, temperature 98.7 F (37.1 C), temperature source Oral, resp. rate 20, last menstrual period 11/08/2014, SpO2 100 %. Lungs:  Clear to auscultation, she has symmetrical respiratory excursion. Heart:  Regular rate and rhythm, normal S1 and S2, no murmur. Abdomen:  Soft, nondistended, nontender, bowel sounds present. Extremity:  No clubbing, cyanosis, or edema. Musculoskeletal:  No tenderness to palpation over the cervical, thoracic, or lumbar spinous processes or paraspinal musculature. Patient does have a moderate scoliosis which is palpable. External: No Battle sign or hemotympanum. There is ecchymosis of the medial aspect of the upper eyelids bilaterally.  Neurological Examination: Mental Status Examination:  Awake and alert, oriented to her name, [name], and September 2016. Her speech is fluent. She has good comprehension. She follows commands briskly. Cranial Nerve Examination:  Pupils are equal, round, reactive  to light, and about 2.5 mm bilaterally. EOMI. Facial sensation intact. Facial movements symmetrical. Hearing present bilaterally. Palatal  movements symmetrical. Shoulder shrug symmetrical. Tongue midline. Motor Examination:  5/5 strength in the upper and lower extremities. No drift of the upper extremities. Sensory Examination:  Intact to pinprick to the distal upper and lower extremities. Reflex Examination:   1+ in the biceps, brachial radialis, and triceps bilaterally. 2+ in the quadriceps bilaterally. 1+ in the gastrocnemius bilaterally. Toes are downgoing bilaterally. Gait and Stance Examination:  Not tested due to the nature the patient's condition.   Results for orders placed or performed during the hospital encounter of 11/13/14 (from the past 48 hour(s))  CBC     Status: Abnormal   Collection Time: 11/13/14  6:57 PM  Result Value Ref Range   WBC 13.8 (H) 4.0 - 10.5 K/uL   RBC 4.19 3.87 - 5.11 MIL/uL   Hemoglobin 13.1 12.0 - 15.0 g/dL   HCT 37.8 36.0 - 46.0 %   MCV 90.2 78.0 - 100.0 fL   MCH 31.3 26.0 - 34.0 pg   MCHC 34.7 30.0 - 36.0 g/dL   RDW 12.4 11.5 - 15.5 %   Platelets 216 150 - 400 K/uL  Basic metabolic panel     Status: Abnormal   Collection Time: 11/13/14  6:57 PM  Result Value Ref Range   Sodium 133 (L) 135 - 145 mmol/L   Potassium 4.1 3.5 - 5.1 mmol/L   Chloride 99 (L) 101 - 111 mmol/L   CO2 21 (L) 22 - 32 mmol/L   Glucose, Bld 146 (H) 65 - 99 mg/dL   BUN 5 (L) 6 - 20 mg/dL   Creatinine, Ser 0.66 0.44 - 1.00 mg/dL   Calcium 8.6 (L) 8.9 - 10.3 mg/dL   GFR calc non Af Amer >60 >60 mL/min   GFR calc Af Amer >60 >60 mL/min    Comment: (NOTE) The eGFR has been calculated using the CKD EPI equation. This calculation has not been validated in all clinical situations. eGFR's persistently <60 mL/min signify possible Chronic Kidney Disease.    Anion gap 13 5 - 15  Protime-INR     Status: None   Collection Time: 11/13/14  6:57 PM  Result Value Ref Range   Prothrombin Time 13.9 11.6 - 15.2 seconds   INR 1.05 0.00 - 1.49    Ct Head Wo Contrast  11/13/2014   CLINICAL DATA:  [place] last night while  drinking but does not remember how, ecchymosis at both eyes, severe headache, nausea, former smoker  EXAM: CT HEAD WITHOUT CONTRAST  CT CERVICAL SPINE WITHOUT CONTRAST  TECHNIQUE: Multidetector CT imaging of the head and cervical spine was performed following the standard protocol without intravenous contrast. Multiplanar CT image reconstructions of the cervical spine were also generated.  COMPARISON:  None  FINDINGS: CT HEAD FINDINGS  Normal ventricular morphology.  Foci of high attenuation are identified within the RIGHT frontal lobe compatible with intraparenchymal hemorrhage, including regions it measured 3.0 x 1.6 cm image 12 and 3.5 x 2.0 cm image 9.  Small subdural hematoma is identified in the RIGHT frontoparietal region up to 3 mm thick.  RIGHT frontal subdural minimally extends onto the anterior aspect of the falx.  Larger area of hemorrhage at the inferior aspect of the RIGHT anterior cranial fossa is identified 4.2 x 2.8 cm favor additional subdural hematoma.  Minimal RIGHT to LEFT midline shift 3 mm.  No additional areas of intraparenchymal hemorrhage identified.  Several  small foci of suspected minimal subarachnoid blood in the RIGHT frontal region.  Edema is seen surrounding the areas of intraparenchymal hemorrhage in RIGHT frontal lobe.  Remaining brain parenchyma normal in appearance.  No evidence of mass or infarction.  Frontal scalp hematoma.  Small nondisplaced RIGHT occipital skull fracture.  Visualized paranasal sinuses and mastoid air cells clear.  CT CERVICAL SPINE FINDINGS  Lung apices clear.  Visualized skullbase intact.  Prevertebral soft tissues normal thickness.  Vertebral body and disc space heights maintained.  No acute cervical spine fracture, subluxation or bone destruction.  Mild facet degenerative changes inferior cervical spine.  Soft tissues unremarkable.  IMPRESSION: RIGHT frontal subdural hematoma up to 3 mm thick, extending laterally into RIGHT temporal region, minimally onto  anterior falx, and probably onto the floor of RIGHT anterior cranial fossa.  Large intra cerebral hemorrhagic contusions at the RIGHT frontal lobe with surrounding edema but only minimal RIGHT to LEFT midline shift.  Nondisplaced occipital fracture.  No acute cervical spine abnormalities.  Critical Value/emergent results were called by telephone at the time of interpretation on 11/13/2014 at 1956 hr to Dr. Noemi Chapel , who verbally acknowledged these results.   Electronically Signed   By: Lavonia Dana M.D.   On: 11/13/2014 20:03   Ct Cervical Spine Wo Contrast  11/13/2014   CLINICAL DATA:  [place] last night while drinking but does not remember how, ecchymosis at both eyes, severe headache, nausea, former smoker  EXAM: CT HEAD WITHOUT CONTRAST  CT CERVICAL SPINE WITHOUT CONTRAST  TECHNIQUE: Multidetector CT imaging of the head and cervical spine was performed following the standard protocol without intravenous contrast. Multiplanar CT image reconstructions of the cervical spine were also generated.  COMPARISON:  None  FINDINGS: CT HEAD FINDINGS  Normal ventricular morphology.  Foci of high attenuation are identified within the RIGHT frontal lobe compatible with intraparenchymal hemorrhage, including regions it measured 3.0 x 1.6 cm image 12 and 3.5 x 2.0 cm image 9.  Small subdural hematoma is identified in the RIGHT frontoparietal region up to 3 mm thick.  RIGHT frontal subdural minimally extends onto the anterior aspect of the falx.  Larger area of hemorrhage at the inferior aspect of the RIGHT anterior cranial fossa is identified 4.2 x 2.8 cm favor additional subdural hematoma.  Minimal RIGHT to LEFT midline shift 3 mm.  No additional areas of intraparenchymal hemorrhage identified.  Several small foci of suspected minimal subarachnoid blood in the RIGHT frontal region.  Edema is seen surrounding the areas of intraparenchymal hemorrhage in RIGHT frontal lobe.  Remaining brain parenchyma normal in appearance.  No  evidence of mass or infarction.  Frontal scalp hematoma.  Small nondisplaced RIGHT occipital skull fracture.  Visualized paranasal sinuses and mastoid air cells clear.  CT CERVICAL SPINE FINDINGS  Lung apices clear.  Visualized skullbase intact.  Prevertebral soft tissues normal thickness.  Vertebral body and disc space heights maintained.  No acute cervical spine fracture, subluxation or bone destruction.  Mild facet degenerative changes inferior cervical spine.  Soft tissues unremarkable.  IMPRESSION: RIGHT frontal subdural hematoma up to 3 mm thick, extending laterally into RIGHT temporal region, minimally onto anterior falx, and probably onto the floor of RIGHT anterior cranial fossa.  Large intra cerebral hemorrhagic contusions at the RIGHT frontal lobe with surrounding edema but only minimal RIGHT to LEFT midline shift.  Nondisplaced occipital fracture.  No acute cervical spine abnormalities.  Critical Value/emergent results were called by telephone at the time of interpretation on 11/13/2014  at 1956 hr to Dr. Noemi Chapel , who verbally acknowledged these results.   Electronically Signed   By: Lavonia Dana M.D.   On: 11/13/2014 20:03     Assessment/Plan: Patient with significant closed head injury, sustained about 21 hours ago, with a large right frontal hemorrhagic cerebral contusion, with a small amount of associated subdural hematoma. She does have a nondisplaced occipital skull fracture. Glasgow Coma Scale 15/15. It is uncertain whether she had any loss of consciousness,, however she certainly does not have recall, certainly significantly contributed to by her significant intoxication last night.  I spoke with the patient, as well as her husband and her mother, both of whom were at the patient's bedside throughout my evaluation and examination.  I've recommended transfer to the neurosurgical ICU at [facility] Admission orders have been written. We will keep patient nothing by  mouth, and supported with IV fluids. Analgesics and antiemetics have been written for. We'll plan on checking a CT of the brain without contrast on the morning of September 20, or sooner if any neurologic deterioration. I spoke with the patient and her family about the severity of her injury, and plans for treatment and care.  Hosie Spangle, MD 11/13/2014, 9:29 PM

## 2014-11-13 NOTE — ED Notes (Signed)
Pt reported drinking alcohol and that she thinks she fell during the night. Pt c/o headache, n/v x15 in 24hrs. Denies diarrhea and abd pain.

## 2014-11-13 NOTE — ED Notes (Signed)
[facility] here to transfer pt to [facility]. 

## 2014-11-13 NOTE — ED Notes (Signed)
Pt reports she was out drinking last night, and remembers falling.  But does not remember how.  Ecchymosis noted to bila eyes.  Pt reports severe h/a and nausea.

## 2014-11-13 NOTE — ED Notes (Signed)
[facility] was called and notified of pt's transfer to [facility]. 

## 2014-11-13 NOTE — ED Notes (Signed)
Dr. Sherwood Gambler at bedside.

## 2014-11-13 NOTE — ED Notes (Signed)
Per EDP, neurosurgeon to come see pt here before transfer to Neuro-ICU.

## 2014-11-14 LAB — BASIC METABOLIC PANEL
Anion gap: 7 (ref 5–15)
BUN: <5 mg/dL — ABNORMAL LOW (ref 6–20)
CO2: 24 mmol/L (ref 22–32)
Calcium: 7.9 mg/dL — ABNORMAL LOW (ref 8.9–10.3)
Chloride: 106 mmol/L (ref 101–111)
Creatinine, Ser: 0.6 mg/dL (ref 0.44–1.00)
GFR calc Af Amer: >60 mL/min (ref >60)
GFR calc non Af Amer: >60 mL/min (ref >60)
Glucose, Bld: 110 mg/dL — ABNORMAL HIGH (ref 65–99)
Potassium: 3.7 mmol/L (ref 3.5–5.1)
Sodium: 137 mmol/L (ref 135–145)

## 2014-11-14 LAB — MRSA PCR SCREENING: MRSA by PCR: NEGATIVE

## 2014-11-14 MED ORDER — HYDROCODONE-ACETAMINOPHEN 5-325 MG PO TABS
1.0000 | ORAL_TABLET | ORAL | Status: DC | PRN
  Administered 2014-11-14 – 2014-11-16 (×6): 2 via ORAL
  Filled 2014-11-14 (×6): qty 2

## 2014-11-14 NOTE — Progress Notes (Signed)
Subjective: Patient's headache better controlled with morphine IV. Nausea has improved. No further vomiting. Continues on IVF.  Objective: Vital signs in last 24 hours: Filed Vitals:   11/14/14 0200 11/14/14 0400 11/14/14 0600 11/14/14 0813  BP: 125/74 122/75 107/79   Pulse: 83 95 80   Temp:  98.5 F (36.9 C)  99.6 F (37.6 C)  TempSrc:  Oral  Oral  Resp: 15 14 14    Weight:      SpO2: 100% 100% 100%     Intake/Output from previous day: 09/18 0701 - 09/19 0700 In: 1250 [I.V.:1250] Out: 900 [Urine:900] Intake/Output this shift:    Physical Exam:  Awake alert, fully oriented. Following commands. Speech fluent. Pupils equal, round, reactive to light. EOMI. Moving all 4 extremities well.  CBC  Recent Labs  11/13/14 1857  WBC 13.8*  HGB 13.1  HCT 37.8  PLT 216   BMET  Recent Labs  11/13/14 1857 11/14/14 0605  NA 133* 137  K 4.1 3.7  CL 99* 106  CO2 21* 24  GLUCOSE 146* 110*  BUN 5* <5*  CREATININE 0.66 0.60  CALCIUM 8.6* 7.9*    Assessment/Plan: Neurologically stable. BMET stable. We'll begin clear liquids, and plan on advancing as tolerated. We'll begin to set the head of bed up even further, and if tolerated advanced out of bed to chair and ambulation. For follow-up CT brain without contrast in a.m.   Hosie Spangle, MD 11/14/2014, 8:22 AM

## 2014-11-14 NOTE — Care Management Note (Signed)
Case Management Note  Patient Details  Name: Rebecca Howell MRN: [id number] Date of Birth: 1975/10/26  Subjective/Objective:    Pt admitted on 11/13/14 with SDH and ICH s/p multiple falls while intoxicated.  PTA, pt independent, lives with spouse.                  Action/Plan: Will follow for discharge planning as pt progresses.    Expected Discharge Date:                  Expected Discharge Plan:  Home/Self Care  In-House Referral:     Discharge planning Services  CM Consult  Post Acute Care Choice:    Choice offered to:     DME Arranged:    DME Agency:     HH Arranged:    HH Agency:     Status of Service:  In process, will continue to follow  Medicare Important Message Given:    Date Medicare IM Given:    Medicare IM give by:    Date Additional Medicare IM Given:    Additional Medicare Important Message give by:     If discussed at [place] of Stay Meetings, dates discussed:    Additional Comments:  Reinaldo Raddle, RN, BSN  Trauma/Neuro ICU Case Manager [phone]

## 2014-11-15 ENCOUNTER — Encounter (HOSPITAL_COMMUNITY): Payer: Self-pay | Admitting: Radiology

## 2014-11-15 ENCOUNTER — Inpatient Hospital Stay (HOSPITAL_COMMUNITY): Payer: Commercial Managed Care - HMO

## 2014-11-15 NOTE — Progress Notes (Signed)
Subjective: Patient feeling better. Less nausea, less photophobia, less headache. Little progress in by mouth intake, taking only small to moderate amount of clear liquids. CT the brain this morning shows no significant change in right frontal hemorrhagic cerebral contusion. Patient's mother at bedside.  Objective: Vital signs in last 24 hours: Filed Vitals:   11/14/14 2000 11/14/14 2356 11/15/14 0000 11/15/14 0400  BP: 119/82  121/80 137/90  Pulse: 69  78   Temp: 99.1 F (37.3 C) 99.7 F (37.6 C)  98.5 F (36.9 C)  TempSrc: Oral Oral  Oral  Resp: 16  16   Height:      Weight:      SpO2: 100%  100%     Intake/Output from previous day: 09/19 0701 - 09/20 0700 In: 2875 [I.V.:2875] Out: 700 [Urine:700] Intake/Output this shift:    Physical Exam:  Awake and alert, oriented fully. Pupils equal, round, react to light. EOMI. Facial movements symmetrical. Moving all 4 extremities well. No drift of upper extremities.  CBC  Recent Labs  11/13/14 1857  WBC 13.8*  HGB 13.1  HCT 37.8  PLT 216   BMET  Recent Labs  11/13/14 1857 11/14/14 0605  NA 133* 137  K 4.1 3.7  CL 99* 106  CO2 21* 24  GLUCOSE 146* 110*  BUN 5* <5*  CREATININE 0.66 0.60  CALCIUM 8.6* 7.9*    Assessment/Plan: Improving. CT stable. Spoke with the patient and her mother again this morning, reviewed CT results. We also discussed plans for progression including advancing diet, mobilizing to the chair and progressing to ambulation, progressing from parenteral to antral analgesia, etc.   Hosie Spangle, MD 11/15/2014, 7:57 AM

## 2014-11-16 MED ORDER — HYDROCODONE-ACETAMINOPHEN 5-325 MG PO TABS: 1.0000 | ORAL_TABLET | ORAL | Status: DC | PRN

## 2014-11-16 NOTE — Progress Notes (Signed)
Patient's discharge paperwork explained to patient and family. Patient verbalized that she understood. IV d/c and patient discharged.

## 2014-11-16 NOTE — Discharge Summary (Signed)
Physician Discharge Summary  Patient ID: Rebecca Howell MRN: [id number] DOB/AGE: September 23, 1975 39 y.o.  Admit date: 11/13/2014 Discharge date: 11/16/2014  Admission Diagnoses:  Closed head injury, nondisplaced skull fracture, right frontal hemorrhagic cerebral contusion, and nausea and vomiting  Discharge Diagnoses:  Closed head injury, nondisplaced skull fracture, right frontal hemorrhagic cerebral contusion, and nausea and vomiting (resolved)  Active Problems:   ICH (intracerebral hemorrhage)   Cerebral contusion   Discharged Condition: Improved  Hospital Course:  Patient was admitted after presenting to the [facility] emergency room about 18 hours after having fallen while intoxicated with disabling headache, photophobia, nausea and associated vomiting.  CT the brain without contrast revealed a large right frontal hemorrhagic cerebral contusion. Patient was admitted to the neurosurgical ICU for supportive care. She is gradually improved, with resolution of her vomiting, than resolution of her nausea. Her headache has diminished as has her photophobia. Follow-up CT of the brain without contrast yesterday showed no change in the right frontal hemorrhagic cerebral contusion. She is up and out of bed, has been advanced to a regular diet. We are discharging her to home with instructions regarding activities and behavior following discharge. Her mother and her husband were present as was another family member. I've instructed relatively sedentary activity, with walking with a family member in the fascia outside of the home, gradually increasing distances, twice a day. No alcohol, and avoidance of fatigue. She is not to return to work. She is to return for follow-up with me in 2 weeks with a CT the brain without contrast on the day of the appointment. She is limited lifting to no greater than 10 pounds, she is not to drive, but can ride in the car locally with her family driving.  Discharge Exam: Blood  pressure 134/94, pulse 83, temperature 98.1 F (36.7 C), temperature source Oral, resp. rate 11, height 5\' 4"  (1.626 m), weight 57.607 kg (127 lb), last menstrual period 11/08/2014, SpO2 100 %.  Disposition:  home     Medication List    STOP taking these medications        ciprofloxacin 250 MG tablet  Commonly known as:  CIPRO     fluconazole 100 MG tablet  Commonly known as:  DIFLUCAN     valACYclovir 500 MG tablet  Commonly known as:  VALTREX      TAKE these medications        ALPRAZolam 0.5 MG tablet  Commonly known as:  XANAX  TAKE 1 TABLET BY MOUTH DAILY AS NEEDED     HYDROcodone-acetaminophen 5-325 MG per tablet  Commonly known as:  NORCO/VICODIN  Take 1-2 tablets by mouth every 4 (four) hours as needed for moderate pain.     ibuprofen 200 MG tablet  Commonly known as:  ADVIL,MOTRIN  Take 400-600 mg by mouth every 6 (six) hours as needed for moderate pain.         SignedHosie Spangle 11/16/2014, 10:13 AM

## 2014-11-23 ENCOUNTER — Other Ambulatory Visit: Payer: Self-pay | Admitting: Neurosurgery

## 2014-11-23 DIAGNOSIS — S065X9A Traumatic subdural hemorrhage with loss of consciousness of unspecified duration, initial encounter: Secondary | ICD-10-CM

## 2014-11-23 DIAGNOSIS — S065XAA Traumatic subdural hemorrhage with loss of consciousness status unknown, initial encounter: Secondary | ICD-10-CM

## 2014-11-29 ENCOUNTER — Ambulatory Visit
Admission: RE | Admit: 2014-11-29 | Discharge: 2014-11-29 | Disposition: A | Discharge status: Home / Self Care | Payer: Commercial Managed Care - HMO | Source: Ambulatory Visit | Attending: Neurosurgery | Admitting: Neurosurgery

## 2014-11-29 DIAGNOSIS — S065XAA Traumatic subdural hemorrhage with loss of consciousness status unknown, initial encounter: Secondary | ICD-10-CM

## 2014-11-29 DIAGNOSIS — S065X9A Traumatic subdural hemorrhage with loss of consciousness of unspecified duration, initial encounter: Secondary | ICD-10-CM

## 2014-12-16 ENCOUNTER — Other Ambulatory Visit: Payer: Self-pay | Admitting: Gynecology

## 2014-12-16 NOTE — Telephone Encounter (Signed)
Dr. JF-I noticed patient seen in ED for head trauma on 11/13/14. She actually had skull fracture and was in neuro ICU.  Note reads: "HPI Comments: 39 year old female, history of no significant medical problems, no anticoagulation, presents after multiple head injuries last night. According to onlookers she had fallen several times while she was heavily intoxicated with alcohol, she does not remember this, she awoke this morning with severe headache, multiple episodes of vomiting, bruising over both of her upper eyelids. She denies seizure activity, she has been generally weak but able to ambulate and speak and has normal mental status. The symptoms are persistent, severe, worse with standing, associated with vertigo."  I just wanted to make you aware of this since patient is requesting refill for Xanax.

## 2014-12-16 NOTE — Telephone Encounter (Signed)
Would not recommend it now after recent head injury. I was looking at Dr. Synthia Innocent note and he had referred her to [facility] in the past. She may want to follow up with her if she is not doing so now. She needs her annual Gyn in November.

## 2014-12-19 NOTE — Telephone Encounter (Signed)
Last filled on 06/13/14 with 5 refills

## 2014-12-20 NOTE — Telephone Encounter (Signed)
I called pharmacy to inform them Rx has been denied.

## 2015-01-11 ENCOUNTER — Other Ambulatory Visit: Payer: Self-pay | Admitting: Neurosurgery

## 2015-01-11 DIAGNOSIS — T148XXA Other injury of unspecified body region, initial encounter: Secondary | ICD-10-CM

## 2015-01-18 ENCOUNTER — Other Ambulatory Visit: Payer: Commercial Managed Care - HMO

## 2015-01-23 ENCOUNTER — Encounter: Payer: Self-pay | Admitting: Gynecology

## 2015-01-24 ENCOUNTER — Ambulatory Visit
Admission: RE | Admit: 2015-01-24 | Discharge: 2015-01-24 | Disposition: A | Discharge status: Home / Self Care | Payer: Commercial Managed Care - HMO | Source: Ambulatory Visit | Attending: Neurosurgery | Admitting: Neurosurgery

## 2015-01-24 ENCOUNTER — Other Ambulatory Visit: Payer: Self-pay

## 2015-01-24 DIAGNOSIS — T148XXA Other injury of unspecified body region, initial encounter: Secondary | ICD-10-CM

## 2015-01-24 MED ORDER — ALPRAZOLAM 0.5 MG PO TABS: 0.5000 mg | ORAL_TABLET | Freq: Every day | ORAL | Status: DC | PRN

## 2015-01-24 NOTE — Telephone Encounter (Signed)
CE scheduled 02/17/15.

## 2015-01-24 NOTE — Telephone Encounter (Signed)
Rx called in 

## 2015-02-17 ENCOUNTER — Encounter: Payer: Self-pay | Admitting: Gynecology

## 2015-08-02 ENCOUNTER — Encounter: Payer: Self-pay | Admitting: Gynecology

## 2015-08-22 ENCOUNTER — Other Ambulatory Visit: Payer: Self-pay | Admitting: Obstetrics & Gynecology

## 2015-08-23 ENCOUNTER — Other Ambulatory Visit: Payer: Self-pay

## 2015-08-23 LAB — CYTOLOGY - PAP

## 2015-10-09 ENCOUNTER — Ambulatory Visit (INDEPENDENT_AMBULATORY_CARE_PROVIDER_SITE_OTHER): Payer: Managed Care, Other (non HMO) | Admitting: Neurology

## 2015-10-09 ENCOUNTER — Encounter: Payer: Self-pay | Admitting: Neurology

## 2015-10-09 VITALS — BP 130/90 | HR 88 | Ht 63.5 in | Wt 130.5 lb

## 2015-10-09 DIAGNOSIS — R481 Agnosia: Secondary | ICD-10-CM | POA: Diagnosis not present

## 2015-10-09 DIAGNOSIS — R432 Parageusia: Secondary | ICD-10-CM

## 2015-10-09 DIAGNOSIS — F4310 Post-traumatic stress disorder, unspecified: Secondary | ICD-10-CM

## 2015-10-09 DIAGNOSIS — F411 Generalized anxiety disorder: Secondary | ICD-10-CM | POA: Diagnosis not present
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
## 2015-10-09 NOTE — Patient Instructions (Signed)
Referral to behavior health

## 2015-10-10 ENCOUNTER — Other Ambulatory Visit: Payer: Self-pay | Admitting: *Deleted

## 2015-10-10 DIAGNOSIS — F411 Generalized anxiety disorder: Secondary | ICD-10-CM

## 2015-10-10 DIAGNOSIS — F4310 Post-traumatic stress disorder, unspecified: Secondary | ICD-10-CM

## 2016-01-17 ENCOUNTER — Other Ambulatory Visit: Payer: Self-pay | Admitting: Obstetrics & Gynecology

## 2016-02-13 LAB — OB RESULTS CONSOLE HEPATITIS B SURFACE ANTIGEN: Hepatitis B Surface Ag: NEGATIVE

## 2016-02-13 LAB — OB RESULTS CONSOLE ANTIBODY SCREEN: Antibody Screen: NEGATIVE

## 2016-02-13 LAB — OB RESULTS CONSOLE ABO/RH: RH TYPE: NEGATIVE

## 2016-02-13 LAB — OB RESULTS CONSOLE RUBELLA ANTIBODY, IGM: Rubella: IMMUNE

## 2016-02-13 LAB — OB RESULTS CONSOLE RPR: RPR: NONREACTIVE

## 2016-02-13 LAB — OB RESULTS CONSOLE HIV ANTIBODY (ROUTINE TESTING): HIV: NONREACTIVE

## 2016-05-23 IMAGING — CT CT HEAD W/O CM
2 series · 16 of 30 positions shown, 18 images · non-contrast
Comparison: 11/15/2014 and 11/13/2014.

CLINICAL DATA: Close head injury with nondisplaced skull fracture,
hemorrhagic contusion of the brain extra-axial subarachnoid and
subdural hemorrhage. Followup of after hospital discharge. Improving
headaches and persistent vertigo.

EXAM:
CT HEAD WITHOUT CONTRAST
TECHNIQUE: Contiguous axial images were obtained from the base of the skull
through the vertex without intravenous contrast.

[Series 3: head bone · axial · 0.49mm/px · z∈[+4,+114]mm · 8 of 56 slices shown]
[im 6/56  bone]
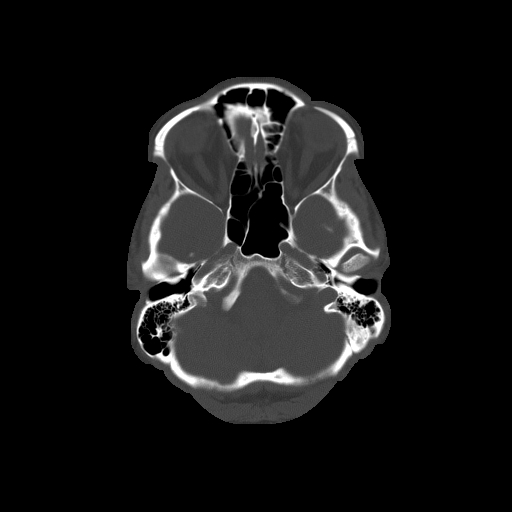
[im 12/56  bone]
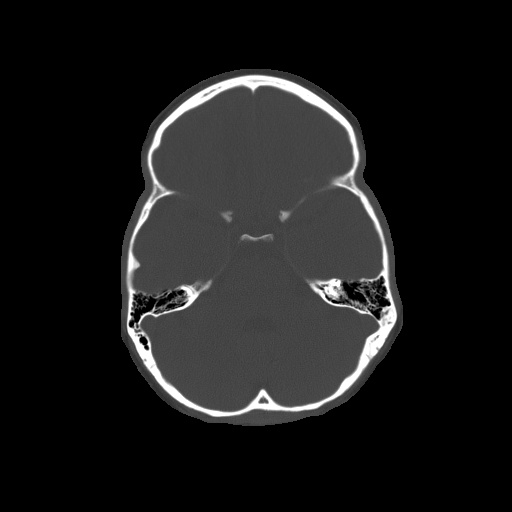
[im 18/56  bone]
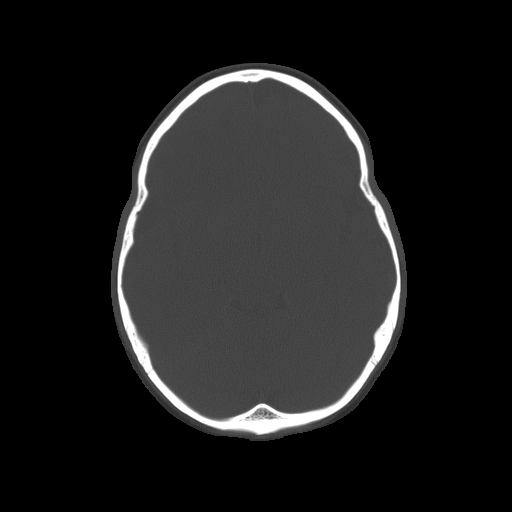
[im 24/56  bone]
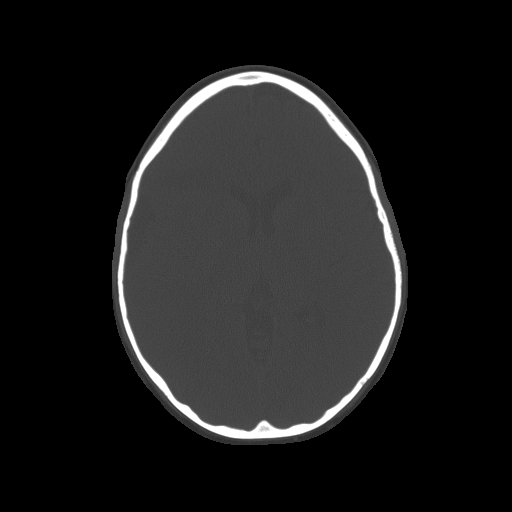
[im 32/56  bone]
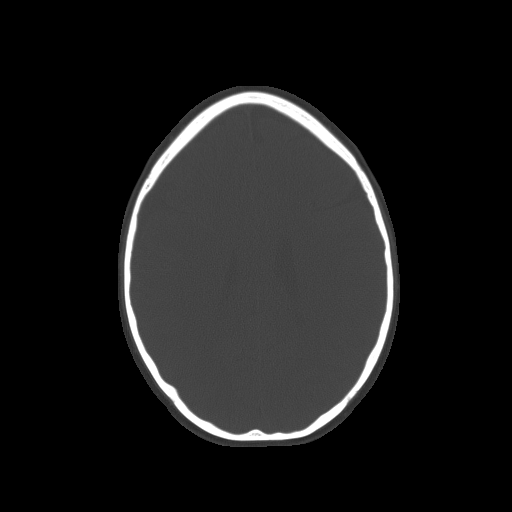
[im 38/56  bone]
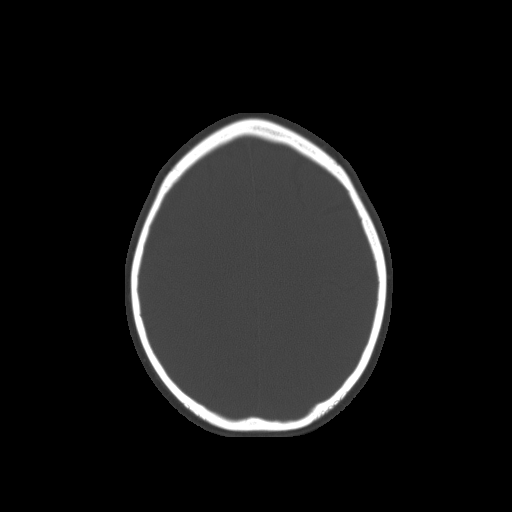
[im 44/56  bone]
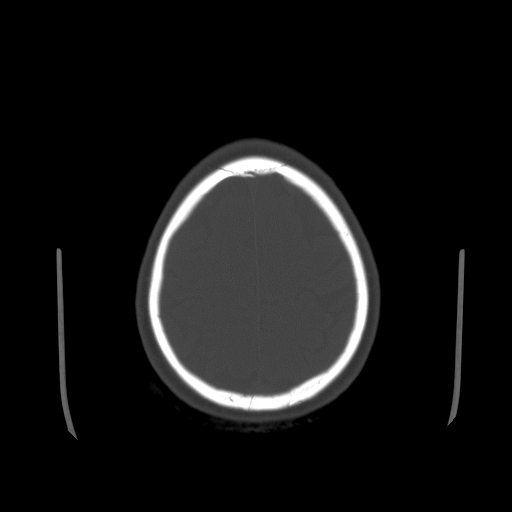
[im 50/56  bone]
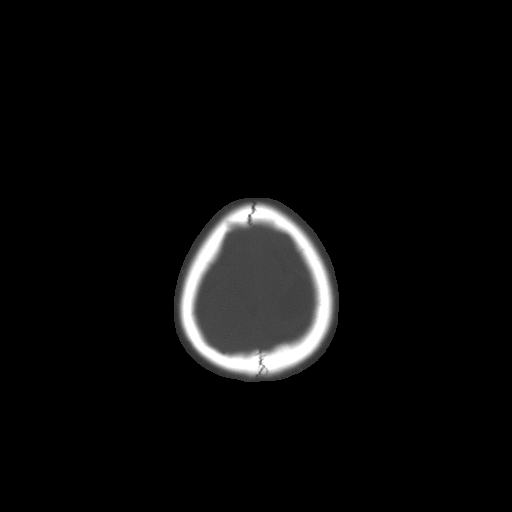

[Series 32: 3d filtered head w/o · axial · non-contrast · 0.49mm/px · z∈[+8,+113]mm · 8 of 28 slices shown, 10 images]
[im 4/28  brain]
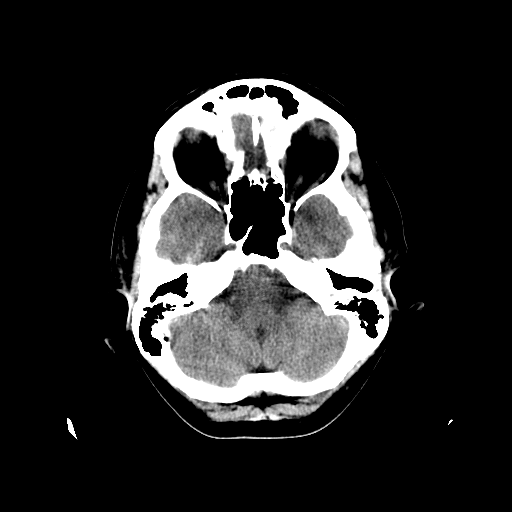
[im 4/28  bone]
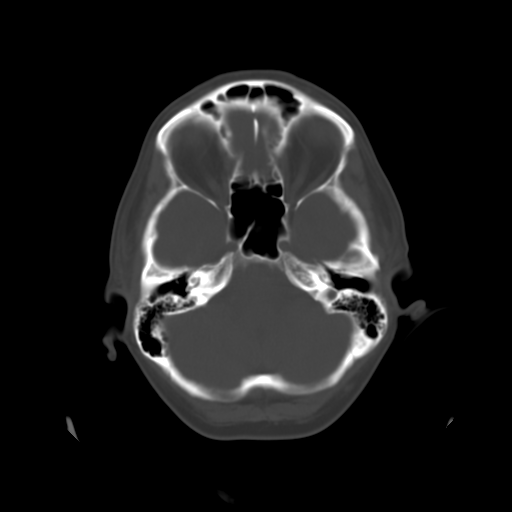
[im 7/28  brain]
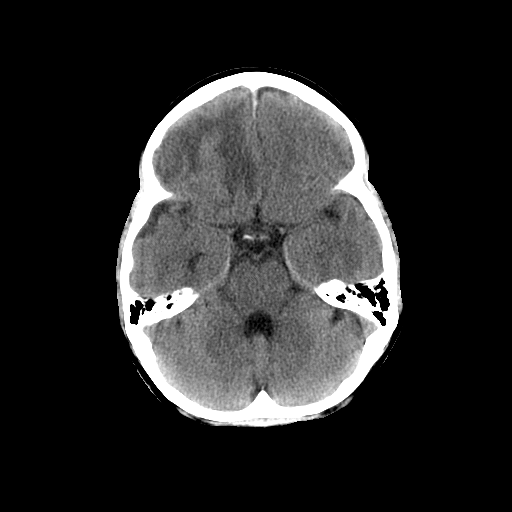
[im 10/28  brain]
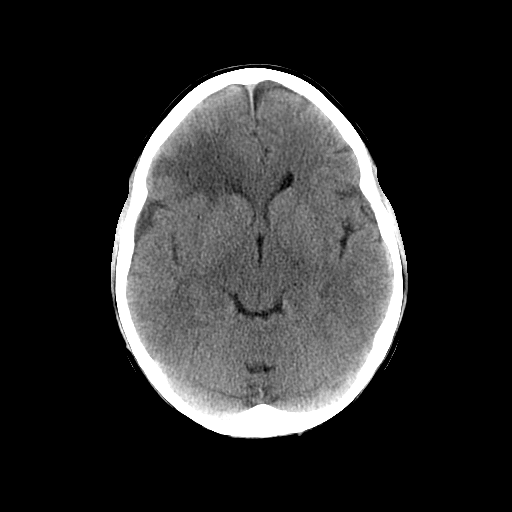
[im 13/28  brain]
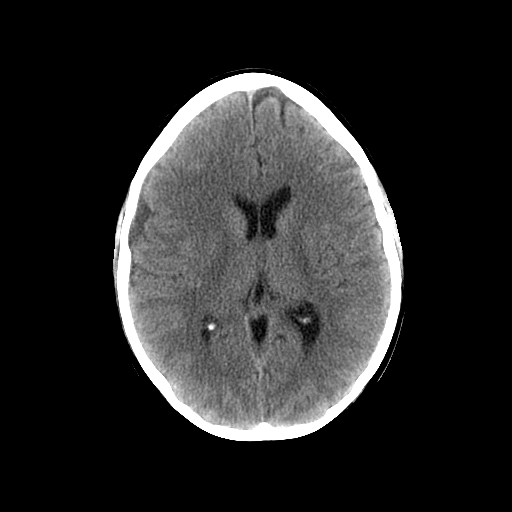
[im 16/28  brain]
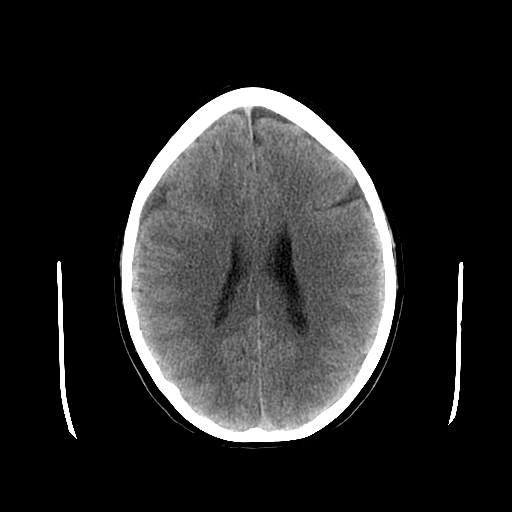
[im 16/28  bone]
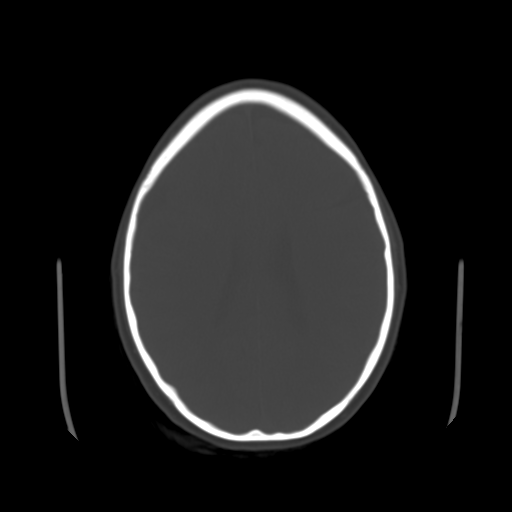
[im 19/28  brain]
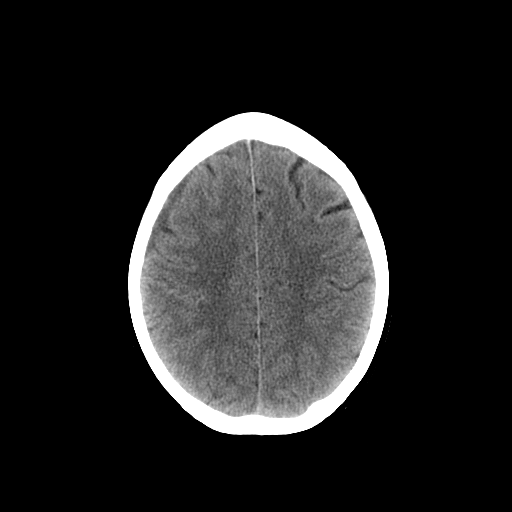
[im 22/28  brain]
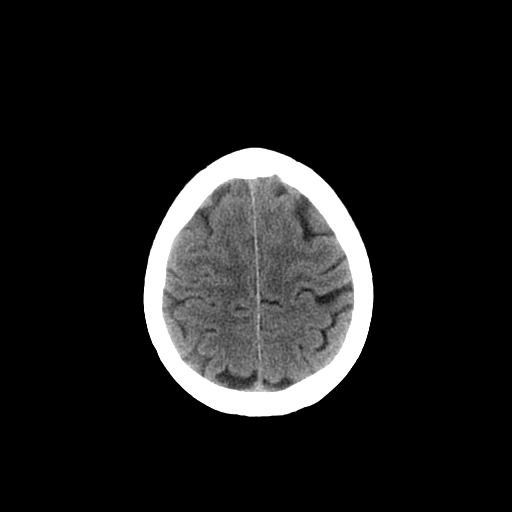
[im 25/28  brain]
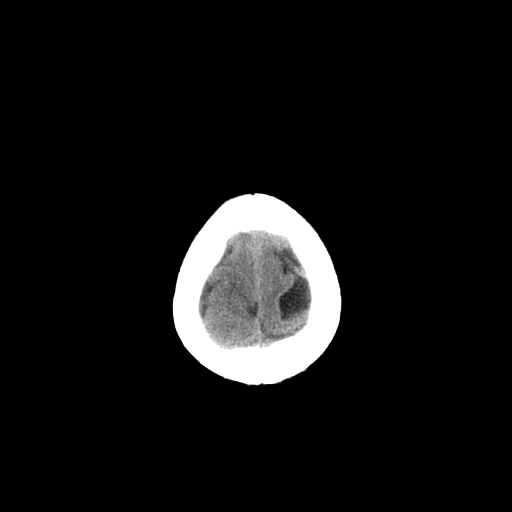

[16 of 30 positions shown; findings below may reference images not displayed]

FINDINGS: Acute component of parenchymal hemorrhage in the inferior right
frontal lobe has resolved with some residual edema and
encephalomalacia present. There is no evidence of mass effect. Acute
component of subarachnoid and subdural hemorrhage is also completely
resolved with small component of low density hygroma/ old blood
product still present in the right frontal subdural region measuring
up to 7 mm in thickness. This is not exerting any significant mass
effect with only roughly 2 mm of midline shift to the left at the
level of the frontal horns.

No new hemorrhage identified. No evidence of hydrocephalus. No
intraventricular blood identified. Stable nondisplaced occipital
fracture and suggestion of mild widening of the sagittal suture.
IMPRESSION: Complete resolution of acute parenchymal hemorrhage in the inferior
right frontal lobe and acute blood products in the subarachnoid and
subdural spaces. Some residual hygroma/ old blood product is present
in the right subdural space measuring up to 7 mm in thickness. There
is minimal 2 mm midline shift to the left.

## 2016-06-30 LAB — OB RESULTS CONSOLE GBS: STREP GROUP B AG: POSITIVE

## 2016-07-10 ENCOUNTER — Encounter: Payer: Self-pay | Admitting: Gynecology

## 2016-07-31 ENCOUNTER — Other Ambulatory Visit: Payer: Self-pay | Admitting: Obstetrics & Gynecology

## 2016-08-05 ENCOUNTER — Other Ambulatory Visit: Payer: Self-pay | Admitting: Obstetrics & Gynecology

## 2016-08-08 ENCOUNTER — Telehealth (HOSPITAL_COMMUNITY): Payer: Self-pay | Admitting: *Deleted

## 2016-08-08 ENCOUNTER — Encounter (HOSPITAL_COMMUNITY): Payer: Self-pay | Admitting: *Deleted

## 2016-08-08 NOTE — Telephone Encounter (Signed)
Preadmission screen  

## 2016-08-09 ENCOUNTER — Encounter (HOSPITAL_COMMUNITY): Payer: Self-pay | Admitting: *Deleted

## 2016-08-23 ENCOUNTER — Inpatient Hospital Stay (HOSPITAL_COMMUNITY): Payer: 59 | Admitting: Anesthesiology

## 2016-08-23 ENCOUNTER — Encounter (HOSPITAL_COMMUNITY): Payer: Self-pay

## 2016-08-23 ENCOUNTER — Encounter (HOSPITAL_COMMUNITY)
Admission: RE | Disposition: A | Discharge status: Home / Self Care | Payer: Self-pay | Source: Ambulatory Visit | Attending: Obstetrics & Gynecology

## 2016-08-23 ENCOUNTER — Inpatient Hospital Stay (HOSPITAL_COMMUNITY)
Admission: RE | Admit: 2016-08-23 | Discharge: 2016-08-25 | DRG: 766 | Disposition: A | Discharge status: Home / Self Care | Payer: 59 | Source: Ambulatory Visit | Attending: Obstetrics & Gynecology | Admitting: Obstetrics & Gynecology

## 2016-08-23 VITALS — BP 99/57 | HR 90 | Temp 98.0°F | Resp 16 | Ht 63.5 in | Wt 178.0 lb

## 2016-08-23 DIAGNOSIS — O09529 Supervision of elderly multigravida, unspecified trimester: Secondary | ICD-10-CM

## 2016-08-23 DIAGNOSIS — Z3A39 39 weeks gestation of pregnancy: Secondary | ICD-10-CM | POA: Diagnosis not present

## 2016-08-23 DIAGNOSIS — Z3493 Encounter for supervision of normal pregnancy, unspecified, third trimester: Secondary | ICD-10-CM | POA: Diagnosis present

## 2016-08-23 DIAGNOSIS — D252 Subserosal leiomyoma of uterus: Secondary | ICD-10-CM | POA: Diagnosis present

## 2016-08-23 DIAGNOSIS — S06339A Contusion and laceration of cerebrum, unspecified, with loss of consciousness of unspecified duration, initial encounter: Secondary | ICD-10-CM

## 2016-08-23 DIAGNOSIS — O99824 Streptococcus B carrier state complicating childbirth: Secondary | ICD-10-CM | POA: Diagnosis present

## 2016-08-23 DIAGNOSIS — Z87891 Personal history of nicotine dependence: Secondary | ICD-10-CM

## 2016-08-23 DIAGNOSIS — O3413 Maternal care for benign tumor of corpus uteri, third trimester: Secondary | ICD-10-CM | POA: Diagnosis present

## 2016-08-23 DIAGNOSIS — O26893 Other specified pregnancy related conditions, third trimester: Secondary | ICD-10-CM | POA: Diagnosis present

## 2016-08-23 DIAGNOSIS — Z6791 Unspecified blood type, Rh negative: Secondary | ICD-10-CM | POA: Diagnosis not present

## 2016-08-23 DIAGNOSIS — S0633AA Contusion and laceration of cerebrum, unspecified, with loss of consciousness status unknown, initial encounter: Secondary | ICD-10-CM

## 2016-08-23 DIAGNOSIS — Z98891 History of uterine scar from previous surgery: Secondary | ICD-10-CM

## 2016-08-23 LAB — CBC
HCT: 35.3 % — ABNORMAL LOW (ref 36.0–46.0)
HEMOGLOBIN: 11.6 g/dL — AB (ref 12.0–15.0)
MCH: 27.8 pg (ref 26.0–34.0)
MCHC: 32.9 g/dL (ref 30.0–36.0)
MCV: 84.7 fL (ref 78.0–100.0)
PLATELETS: 211 10*3/uL (ref 150–400)
RBC: 4.17 MIL/uL (ref 3.87–5.11)
RDW: 14.9 % (ref 11.5–15.5)
WBC: 10.2 10*3/uL (ref 4.0–10.5)

## 2016-08-23 LAB — RPR: RPR: NONREACTIVE

## 2016-08-23 SURGERY — Surgical Case
Anesthesia: Epidural | Site: Abdomen | Wound class: Clean Contaminated

## 2016-08-23 MED ORDER — PHENYLEPHRINE 40 MCG/ML (10ML) SYRINGE FOR IV PUSH (FOR BLOOD PRESSURE SUPPORT)
PREFILLED_SYRINGE | INTRAVENOUS | Status: AC
  Filled 2016-08-23: qty 10

## 2016-08-23 MED ORDER — OXYTOCIN 40 UNITS IN LACTATED RINGERS INFUSION - SIMPLE MED: 2.5000 [IU]/h | INTRAVENOUS | Status: DC

## 2016-08-23 MED ORDER — LIDOCAINE HCL (PF) 1 % IJ SOLN: 30.0000 mL | INTRAMUSCULAR | Status: DC | PRN

## 2016-08-23 MED ORDER — OXYCODONE-ACETAMINOPHEN 5-325 MG PO TABS
2.0000 | ORAL_TABLET | ORAL | Status: DC | PRN
Start: 2016-08-23 — End: 2016-08-25
  Administered 2016-08-24 – 2016-08-25 (×5): 2 via ORAL
  Filled 2016-08-23 (×4): qty 2

## 2016-08-23 MED ORDER — DEXAMETHASONE SODIUM PHOSPHATE 10 MG/ML IJ SOLN
INTRAMUSCULAR | Status: DC | PRN
Start: 2016-08-23 — End: 2016-08-23
  Administered 2016-08-23: 10 mg via INTRAVENOUS

## 2016-08-23 MED ORDER — FENTANYL 2.5 MCG/ML BUPIVACAINE 1/10 % EPIDURAL INFUSION (WH - ANES)
14.0000 mL/h | INTRAMUSCULAR | Status: DC | PRN
  Administered 2016-08-23: 14 mL/h via EPIDURAL
  Filled 2016-08-23: qty 100

## 2016-08-23 MED ORDER — SIMETHICONE 80 MG PO CHEW
80.0000 mg | CHEWABLE_TABLET | Freq: Three times a day (TID) | ORAL | Status: DC
  Administered 2016-08-24 – 2016-08-25 (×4): 80 mg via ORAL
  Filled 2016-08-23 (×4): qty 1

## 2016-08-23 MED ORDER — LIDOCAINE HCL (PF) 1 % IJ SOLN
INTRAMUSCULAR | Status: DC | PRN
  Administered 2016-08-23 (×2): 5 mL via EPIDURAL

## 2016-08-23 MED ORDER — METHYLERGONOVINE MALEATE 0.2 MG PO TABS: 0.2000 mg | ORAL_TABLET | ORAL | Status: DC | PRN

## 2016-08-23 MED ORDER — SIMETHICONE 80 MG PO CHEW: 80.0000 mg | CHEWABLE_TABLET | ORAL | Status: DC | PRN

## 2016-08-23 MED ORDER — LACTATED RINGERS IV SOLN
500.0000 mL | Freq: Once | INTRAVENOUS | Status: AC
  Administered 2016-08-23: 500 mL via INTRAVENOUS

## 2016-08-23 MED ORDER — SODIUM CHLORIDE 0.9% FLUSH: 3.0000 mL | INTRAVENOUS | Status: DC | PRN

## 2016-08-23 MED ORDER — TERBUTALINE SULFATE 1 MG/ML IJ SOLN: 0.2500 mg | Freq: Once | INTRAMUSCULAR | Status: DC | PRN

## 2016-08-23 MED ORDER — PROMETHAZINE HCL 25 MG/ML IJ SOLN: 6.2500 mg | INTRAMUSCULAR | Status: DC | PRN

## 2016-08-23 MED ORDER — PHENYLEPHRINE HCL 10 MG/ML IJ SOLN
INTRAMUSCULAR | Status: DC | PRN
  Administered 2016-08-23: 80 ug via INTRAVENOUS
  Administered 2016-08-23: 40 ug via INTRAVENOUS
  Administered 2016-08-23 (×2): 80 ug via INTRAVENOUS
  Administered 2016-08-23: 120 ug via INTRAVENOUS

## 2016-08-23 MED ORDER — ONDANSETRON HCL 4 MG/2ML IJ SOLN: 4.0000 mg | Freq: Four times a day (QID) | INTRAMUSCULAR | Status: DC | PRN

## 2016-08-23 MED ORDER — DEXAMETHASONE SODIUM PHOSPHATE 10 MG/ML IJ SOLN
INTRAMUSCULAR | Status: AC
  Filled 2016-08-23: qty 1

## 2016-08-23 MED ORDER — DIPHENHYDRAMINE HCL 25 MG PO CAPS: 25.0000 mg | ORAL_CAPSULE | ORAL | Status: DC | PRN

## 2016-08-23 MED ORDER — NALBUPHINE SYRINGE 5 MG/0.5 ML
5.0000 mg | INJECTION | Freq: Once | INTRAMUSCULAR | Status: DC | PRN
  Filled 2016-08-23: qty 0.5

## 2016-08-23 MED ORDER — OXYTOCIN 40 UNITS IN LACTATED RINGERS INFUSION - SIMPLE MED
1.0000 m[IU]/min | INTRAVENOUS | Status: DC
Start: 2016-08-23 — End: 2016-08-23
  Administered 2016-08-23 (×2): 2 m[IU]/min via INTRAVENOUS
  Filled 2016-08-23: qty 1000

## 2016-08-23 MED ORDER — LACTATED RINGERS IV SOLN: 500.0000 mL | INTRAVENOUS | Status: DC | PRN

## 2016-08-23 MED ORDER — KETOROLAC TROMETHAMINE 30 MG/ML IJ SOLN: 30.0000 mg | Freq: Four times a day (QID) | INTRAMUSCULAR | Status: AC | PRN

## 2016-08-23 MED ORDER — DIPHENHYDRAMINE HCL 50 MG/ML IJ SOLN: 12.5000 mg | INTRAMUSCULAR | Status: DC | PRN

## 2016-08-23 MED ORDER — COCONUT OIL OIL: 1.0000 "application " | TOPICAL_OIL | Status: DC | PRN

## 2016-08-23 MED ORDER — HYDROMORPHONE HCL 1 MG/ML IJ SOLN: 0.2500 mg | INTRAMUSCULAR | Status: DC | PRN

## 2016-08-23 MED ORDER — PRENATAL MULTIVITAMIN CH
1.0000 | ORAL_TABLET | Freq: Every day | ORAL | Status: DC
  Administered 2016-08-24 – 2016-08-25 (×2): 1 via ORAL
  Filled 2016-08-23 (×2): qty 1

## 2016-08-23 MED ORDER — NALOXONE HCL 2 MG/2ML IJ SOSY
1.0000 ug/kg/h | PREFILLED_SYRINGE | INTRAVENOUS | Status: DC | PRN
  Filled 2016-08-23: qty 2

## 2016-08-23 MED ORDER — LACTATED RINGERS IV SOLN
INTRAVENOUS | Status: DC | PRN
  Administered 2016-08-23: 18:00:00 via INTRAVENOUS

## 2016-08-23 MED ORDER — MENTHOL 3 MG MT LOZG: 1.0000 | LOZENGE | OROMUCOSAL | Status: DC | PRN

## 2016-08-23 MED ORDER — ZOLPIDEM TARTRATE 5 MG PO TABS: 5.0000 mg | ORAL_TABLET | Freq: Every evening | ORAL | Status: DC | PRN

## 2016-08-23 MED ORDER — TETANUS-DIPHTH-ACELL PERTUSSIS 5-2.5-18.5 LF-MCG/0.5 IM SUSP: 0.5000 mL | Freq: Once | INTRAMUSCULAR | Status: DC

## 2016-08-23 MED ORDER — SCOPOLAMINE 1 MG/3DAYS TD PT72
MEDICATED_PATCH | TRANSDERMAL | Status: AC
  Filled 2016-08-23: qty 1

## 2016-08-23 MED ORDER — ONDANSETRON HCL 4 MG/2ML IJ SOLN
INTRAMUSCULAR | Status: AC
  Filled 2016-08-23: qty 2

## 2016-08-23 MED ORDER — PHENYLEPHRINE 40 MCG/ML (10ML) SYRINGE FOR IV PUSH (FOR BLOOD PRESSURE SUPPORT)
80.0000 ug | PREFILLED_SYRINGE | INTRAVENOUS | Status: DC | PRN
  Filled 2016-08-23: qty 10

## 2016-08-23 MED ORDER — OXYTOCIN 10 UNIT/ML IJ SOLN
INTRAVENOUS | Status: DC | PRN
  Administered 2016-08-23: 40 [IU] via INTRAVENOUS

## 2016-08-23 MED ORDER — BUPIVACAINE HCL (PF) 0.25 % IJ SOLN
INTRAMUSCULAR | Status: DC | PRN
  Administered 2016-08-23: 30 mL

## 2016-08-23 MED ORDER — OXYTOCIN BOLUS FROM INFUSION: 500.0000 mL | Freq: Once | INTRAVENOUS | Status: DC

## 2016-08-23 MED ORDER — LACTATED RINGERS IV SOLN
INTRAVENOUS | Status: DC
  Administered 2016-08-23: 13:00:00 via INTRAUTERINE

## 2016-08-23 MED ORDER — ACETAMINOPHEN 325 MG PO TABS
650.0000 mg | ORAL_TABLET | ORAL | Status: DC | PRN
  Filled 2016-08-23: qty 2

## 2016-08-23 MED ORDER — LACTATED RINGERS IV SOLN
INTRAVENOUS | Status: DC
  Administered 2016-08-23 (×4): via INTRAVENOUS

## 2016-08-23 MED ORDER — OXYTOCIN 40 UNITS IN LACTATED RINGERS INFUSION - SIMPLE MED
2.5000 [IU]/h | INTRAVENOUS | Status: AC
  Administered 2016-08-24: 2.5 [IU]/h via INTRAVENOUS
  Filled 2016-08-23: qty 1000

## 2016-08-23 MED ORDER — DIPHENHYDRAMINE HCL 25 MG PO CAPS: 25.0000 mg | ORAL_CAPSULE | Freq: Four times a day (QID) | ORAL | Status: DC | PRN

## 2016-08-23 MED ORDER — EPHEDRINE 5 MG/ML INJ: 10.0000 mg | INTRAVENOUS | Status: DC | PRN

## 2016-08-23 MED ORDER — SOD CITRATE-CITRIC ACID 500-334 MG/5ML PO SOLN
30.0000 mL | ORAL | Status: DC | PRN
  Administered 2016-08-23: 30 mL via ORAL
  Filled 2016-08-23 (×3): qty 15

## 2016-08-23 MED ORDER — METHYLERGONOVINE MALEATE 0.2 MG/ML IJ SOLN: 0.2000 mg | INTRAMUSCULAR | Status: DC | PRN

## 2016-08-23 MED ORDER — MEPERIDINE HCL 25 MG/ML IJ SOLN: 6.2500 mg | INTRAMUSCULAR | Status: DC | PRN

## 2016-08-23 MED ORDER — PHENYLEPHRINE 40 MCG/ML (10ML) SYRINGE FOR IV PUSH (FOR BLOOD PRESSURE SUPPORT): 80.0000 ug | PREFILLED_SYRINGE | INTRAVENOUS | Status: DC | PRN

## 2016-08-23 MED ORDER — MORPHINE SULFATE (PF) 0.5 MG/ML IJ SOLN
INTRAMUSCULAR | Status: DC | PRN
  Administered 2016-08-23: 4 mg via EPIDURAL
  Administered 2016-08-23: 1 mg via INTRAVENOUS

## 2016-08-23 MED ORDER — ONDANSETRON HCL 4 MG/2ML IJ SOLN
INTRAMUSCULAR | Status: DC | PRN
  Administered 2016-08-23: 4 mg via INTRAVENOUS

## 2016-08-23 MED ORDER — IBUPROFEN 600 MG PO TABS
600.0000 mg | ORAL_TABLET | Freq: Four times a day (QID) | ORAL | Status: DC
  Administered 2016-08-24 – 2016-08-25 (×6): 600 mg via ORAL
  Filled 2016-08-23 (×6): qty 1

## 2016-08-23 MED ORDER — PENICILLIN G POTASSIUM 5000000 UNITS IJ SOLR
5.0000 10*6.[IU] | Freq: Once | INTRAVENOUS | Status: AC
  Administered 2016-08-23: 5 10*6.[IU] via INTRAVENOUS
  Filled 2016-08-23: qty 5

## 2016-08-23 MED ORDER — NALBUPHINE SYRINGE 5 MG/0.5 ML
5.0000 mg | INJECTION | INTRAMUSCULAR | Status: DC | PRN
  Administered 2016-08-24: 5 mg via INTRAVENOUS
  Filled 2016-08-23: qty 0.5

## 2016-08-23 MED ORDER — SIMETHICONE 80 MG PO CHEW
80.0000 mg | CHEWABLE_TABLET | ORAL | Status: DC
  Administered 2016-08-23 – 2016-08-24 (×2): 80 mg via ORAL
  Filled 2016-08-23 (×2): qty 1

## 2016-08-23 MED ORDER — MISOPROSTOL 25 MCG QUARTER TABLET
25.0000 ug | ORAL_TABLET | ORAL | Status: DC | PRN
  Administered 2016-08-23 (×2): 25 ug via VAGINAL
  Filled 2016-08-23 (×2): qty 1

## 2016-08-23 MED ORDER — CEFAZOLIN SODIUM-DEXTROSE 2-3 GM-% IV SOLR
INTRAVENOUS | Status: DC | PRN
  Administered 2016-08-23: 2 g via INTRAVENOUS

## 2016-08-23 MED ORDER — SODIUM CHLORIDE 0.9 % IR SOLN
Status: DC | PRN
  Administered 2016-08-23: 1000 mL

## 2016-08-23 MED ORDER — WITCH HAZEL-GLYCERIN EX PADS: 1.0000 "application " | MEDICATED_PAD | CUTANEOUS | Status: DC | PRN

## 2016-08-23 MED ORDER — DIBUCAINE 1 % RE OINT: 1.0000 "application " | TOPICAL_OINTMENT | RECTAL | Status: DC | PRN

## 2016-08-23 MED ORDER — SCOPOLAMINE 1 MG/3DAYS TD PT72
1.0000 | MEDICATED_PATCH | Freq: Once | TRANSDERMAL | Status: DC
  Filled 2016-08-23: qty 1

## 2016-08-23 MED ORDER — LACTATED RINGERS IV SOLN
INTRAVENOUS | Status: DC
  Administered 2016-08-23 – 2016-08-24 (×2): via INTRAVENOUS

## 2016-08-23 MED ORDER — MORPHINE SULFATE (PF) 0.5 MG/ML IJ SOLN
INTRAMUSCULAR | Status: AC
  Filled 2016-08-23: qty 10

## 2016-08-23 MED ORDER — ACETAMINOPHEN 325 MG PO TABS: 650.0000 mg | ORAL_TABLET | ORAL | Status: DC | PRN

## 2016-08-23 MED ORDER — LIDOCAINE-EPINEPHRINE (PF) 2 %-1:200000 IJ SOLN
INTRAMUSCULAR | Status: DC | PRN
  Administered 2016-08-23 (×2): 5 mL via EPIDURAL

## 2016-08-23 MED ORDER — NALOXONE HCL 0.4 MG/ML IJ SOLN: 0.4000 mg | INTRAMUSCULAR | Status: DC | PRN

## 2016-08-23 MED ORDER — BUPIVACAINE HCL (PF) 0.25 % IJ SOLN
INTRAMUSCULAR | Status: AC
  Filled 2016-08-23: qty 30

## 2016-08-23 MED ORDER — OXYTOCIN 10 UNIT/ML IJ SOLN
INTRAMUSCULAR | Status: AC
  Filled 2016-08-23: qty 4

## 2016-08-23 MED ORDER — PENICILLIN G POT IN DEXTROSE 60000 UNIT/ML IV SOLN
3.0000 10*6.[IU] | INTRAVENOUS | Status: DC
  Administered 2016-08-23 (×3): 3 10*6.[IU] via INTRAVENOUS
  Filled 2016-08-23 (×8): qty 50

## 2016-08-23 MED ORDER — SCOPOLAMINE 1 MG/3DAYS TD PT72
MEDICATED_PATCH | TRANSDERMAL | Status: DC | PRN
  Administered 2016-08-23: 1 via TRANSDERMAL

## 2016-08-23 MED ORDER — ONDANSETRON HCL 4 MG/2ML IJ SOLN: 4.0000 mg | Freq: Three times a day (TID) | INTRAMUSCULAR | Status: DC | PRN

## 2016-08-23 MED ORDER — CEFAZOLIN SODIUM-DEXTROSE 2-4 GM/100ML-% IV SOLN
INTRAVENOUS | Status: AC
  Filled 2016-08-23: qty 100

## 2016-08-23 MED ORDER — OXYCODONE-ACETAMINOPHEN 5-325 MG PO TABS
1.0000 | ORAL_TABLET | ORAL | Status: DC | PRN
  Administered 2016-08-24: 1 via ORAL
  Filled 2016-08-23 (×4): qty 1

## 2016-08-23 MED ORDER — NALBUPHINE SYRINGE 5 MG/0.5 ML
5.0000 mg | INJECTION | INTRAMUSCULAR | Status: DC | PRN
  Filled 2016-08-23: qty 0.5

## 2016-08-23 MED ORDER — SENNOSIDES-DOCUSATE SODIUM 8.6-50 MG PO TABS
2.0000 | ORAL_TABLET | ORAL | Status: DC
  Administered 2016-08-24: 2 via ORAL
  Filled 2016-08-23: qty 2

## 2016-08-23 SURGICAL SUPPLY — 43 items
APL SKNCLS STERI-STRIP NONHPOA (GAUZE/BANDAGES/DRESSINGS) ×1
BENZOIN TINCTURE PRP APPL 2/3 (GAUZE/BANDAGES/DRESSINGS) ×3 IMPLANT
CHLORAPREP W/TINT 26ML (MISCELLANEOUS) ×3 IMPLANT
CLAMP CORD UMBIL (MISCELLANEOUS) IMPLANT
CLOSURE WOUND 1/2 X4 (GAUZE/BANDAGES/DRESSINGS) ×1
CLOTH BEACON ORANGE TIMEOUT ST (SAFETY) ×3 IMPLANT
DRSG OPSITE POSTOP 4X10 (GAUZE/BANDAGES/DRESSINGS) ×3 IMPLANT
ELECT REM PT RETURN 9FT ADLT (ELECTROSURGICAL) ×3
ELECTRODE REM PT RTRN 9FT ADLT (ELECTROSURGICAL) ×1 IMPLANT
EXTRACTOR VACUUM BELL STYLE (SUCTIONS) IMPLANT
EXTRACTOR VACUUM KIWI (MISCELLANEOUS) IMPLANT
GLOVE BIO SURGEON STRL SZ 6.5 (GLOVE) ×2 IMPLANT
GLOVE BIO SURGEONS STRL SZ 6.5 (GLOVE) ×1
GLOVE BIOGEL PI IND STRL 7.0 (GLOVE) ×2 IMPLANT
GLOVE BIOGEL PI INDICATOR 7.0 (GLOVE) ×4
GOWN STRL REUS W/TWL LRG LVL3 (GOWN DISPOSABLE) ×9 IMPLANT
KIT ABG SYR 3ML LUER SLIP (SYRINGE) IMPLANT
NDL HYPO 25X5/8 SAFETYGLIDE (NEEDLE) IMPLANT
NEEDLE HYPO 22GX1.5 SAFETY (NEEDLE) IMPLANT
NEEDLE HYPO 25X5/8 SAFETYGLIDE (NEEDLE) IMPLANT
NS IRRIG 1000ML POUR BTL (IV SOLUTION) ×3 IMPLANT
PACK C SECTION WH (CUSTOM PROCEDURE TRAY) ×3 IMPLANT
PAD ABD 7.5X8 STRL (GAUZE/BANDAGES/DRESSINGS) ×2 IMPLANT
PAD OB MATERNITY 4.3X12.25 (PERSONAL CARE ITEMS) ×3 IMPLANT
PENCIL SMOKE EVAC W/HOLSTER (ELECTROSURGICAL) ×3 IMPLANT
RTRCTR C-SECT PINK 25CM LRG (MISCELLANEOUS) ×3 IMPLANT
SPONGE GAUZE 4X4 12PLY STER LF (GAUZE/BANDAGES/DRESSINGS) ×4 IMPLANT
STRIP CLOSURE SKIN 1/2X4 (GAUZE/BANDAGES/DRESSINGS) ×2 IMPLANT
SUT CHROMIC 2 0 CT 1 (SUTURE) ×6 IMPLANT
SUT MNCRL 0 VIOLET CTX 36 (SUTURE) ×2 IMPLANT
SUT MONOCRYL 0 CTX 36 (SUTURE) ×4
SUT PDS AB 0 CTX 36 PDP370T (SUTURE) IMPLANT
SUT PDS AB 0 CTX 60 (SUTURE) ×2 IMPLANT
SUT PLAIN 2 0 (SUTURE)
SUT PLAIN 2 0 XLH (SUTURE) ×2 IMPLANT
SUT PLAIN ABS 2-0 CT1 27XMFL (SUTURE) IMPLANT
SUT VIC AB 0 CTX 36 (SUTURE) ×6
SUT VIC AB 0 CTX36XBRD ANBCTRL (SUTURE) ×2 IMPLANT
SUT VIC AB 4-0 KS 27 (SUTURE) ×3 IMPLANT
SYR CONTROL 10ML LL (SYRINGE) IMPLANT
TAPE MEDIFIX FOAM 3 (GAUZE/BANDAGES/DRESSINGS) ×2 IMPLANT
TOWEL OR 17X24 6PK STRL BLUE (TOWEL DISPOSABLE) ×3 IMPLANT
TRAY FOLEY BAG SILVER LF 14FR (SET/KITS/TRAYS/PACK) ×3 IMPLANT

## 2016-08-23 NOTE — Anesthesia Pain Management Evaluation Note (Signed)
  [facility] Pain Management Visit Note  Patient: Rebecca Howell, 41 y.o., female  "Hello I am a member of the anesthesia team at [facility]. We have an anesthesia team available at all times to provide care throughout the hospital, including epidural management and anesthesia for C-section. I don't know your plan for the delivery whether it a natural birth, water birth, IV sedation, nitrous supplementation, doula or epidural, but we want to meet your pain goals."   1.Was your pain managed to your expectations on prior hospitalizations?   Yes   2.What is your expectation for pain management during this hospitalization?     Epidural  3.How can we help you reach that goal? Support prn  Record the patient's initial score and the patient's pain goal.   Pain: 0  Pain Goal: 6 [facility] wants you to be able to say your pain was always managed very well.  [facility] 08/23/2016

## 2016-08-23 NOTE — Anesthesia Preprocedure Evaluation (Signed)
Anesthesia Evaluation  Patient identified by MRN, date of birth, ID band Patient awake    Reviewed: Allergy & Precautions, NPO status , Patient's Chart, lab work & pertinent test results  History of Anesthesia Complications (+) PONV and history of anesthetic complications  Airway Mallampati: II  TM Distance: >3 FB Neck ROM: Full    Dental no notable dental hx. (+) Dental Advisory Given   Pulmonary neg pulmonary ROS, former smoker,    Pulmonary exam normal        Cardiovascular negative cardio ROS Normal cardiovascular exam     Neuro/Psych PSYCHIATRIC DISORDERS Anxiety Depression negative neurological ROS     GI/Hepatic Neg liver ROS, GERD  Controlled,  Endo/Other  negative endocrine ROS  Renal/GU negative Renal ROS  negative genitourinary   Musculoskeletal negative musculoskeletal ROS (+)   Abdominal   Peds negative pediatric ROS (+)  Hematology negative hematology ROS (+)   Anesthesia Other Findings   Reproductive/Obstetrics (+) Pregnancy                             Anesthesia Physical Anesthesia Plan  ASA: II  Anesthesia Plan: Epidural   Post-op Pain Management:    Induction:   PONV Risk Score and Plan:   Airway Management Planned: Natural Airway  Additional Equipment:   Intra-op Plan:   Post-operative Plan:   Informed Consent: I have reviewed the patients History and Physical, chart, labs and discussed the procedure including the risks, benefits and alternatives for the proposed anesthesia with the patient or authorized representative who has indicated his/her understanding and acceptance.   Dental advisory given  Plan Discussed with: CRNA, Anesthesiologist and Surgeon  Anesthesia Plan Comments:         Anesthesia Quick Evaluation

## 2016-08-23 NOTE — Progress Notes (Signed)
Pre Operative Note:  Patient w/o complaints  AVSS EFM:  Baseline 130s minimal variability no accels, Repetitive late decels   Toco: ctx q 1-2 min  SVE: 5.5 cm /60/ -2  Assessment: Cat III tracing Fetal intolerance of labor  Plan: Reviewed course with patient and advised that we proceed with a primary c-section Reviewed the Risks of the surgery and she voiced understanding Consent signed, witnessed and placed into chart.   Barrett Holthaus STACIA

## 2016-08-23 NOTE — Anesthesia Procedure Notes (Signed)
Epidural Patient location during procedure: OB Start time: 08/23/2016 11:17 AM End time: 08/23/2016 12:32 PM  Staffing Anesthesiologist: Duane Boston Performed: anesthesiologist   Preanesthetic Checklist Completed: patient identified, site marked, pre-op evaluation, timeout performed, IV checked, risks and benefits discussed and monitors and equipment checked  Epidural Patient position: sitting Prep: DuraPrep Patient monitoring: heart rate, cardiac monitor, continuous pulse ox and blood pressure Approach: midline Location: L2-L3 Injection technique: LOR saline  Needle:  Needle type: Tuohy  Needle gauge: 17 G Needle length: 9 cm Needle insertion depth: 6 cm Catheter size: 20 Guage Catheter at skin depth: 10 cm Test dose: negative and Other  Assessment Events: blood not aspirated, injection not painful, no injection resistance and negative IV test  Additional Notes Informed consent obtained prior to proceeding including risk of failure, 1% risk of PDPH, risk of minor discomfort and bruising.  Discussed rare but serious complications including epidural abscess, permanent nerve injury, epidural hematoma.  Discussed alternatives to epidural analgesia and patient desires to proceed.  Timeout performed pre-procedure verifying patient name, procedure, and platelet count.  Patient tolerated procedure well.

## 2016-08-23 NOTE — Progress Notes (Signed)
Rebecca Howell is a 41 y.o. G1P0 at [redacted]w[redacted]d by LMP admitted for induction of labor due to Elective at term.  Subjective: Patient comfortable s/p epidural  Objective: BP (!) 91/57   Pulse 81   Temp 97 F (36.1 C) (Axillary)   Resp 16   Ht 5' 3.5" (1.613 m)   Wt 80.8 kg (178 lb 3.2 oz)   LMP 11/17/2015   SpO2 100%   BMI 31.07 kg/m  No intake/output data recorded. No intake/output data recorded.  FHT:  FHR: 135 bpm, variability: minimal ,  accelerations:  Abscent,  decelerations:  Present variable decels nadir 80-90s with return to baseline UC:   irregular, every 3-5 minutes SVE:   Dilation: 5.5 Effacement (%): Thick Station: -3 Exam by:: Rebecca Howell  Labs: Lab Results  Component Value Date   WBC 10.2 08/23/2016   HGB 11.6 (L) 08/23/2016   HCT 35.3 (L) 08/23/2016   MCV 84.7 08/23/2016   PLT 211 08/23/2016    Assessment / Plan: Induction of labor due to [place], elective at term Foley bulb out and patient 5-6 cervix @60 % AROM performed, bloody Discussed with patient possibility of abruption Cat II tracing. Discussed with patient will try amnioinfusion.  If no improved in FHT Will proceed with c-section   Rebecca Howell, [place] 08/23/2016, 1:29 PM

## 2016-08-23 NOTE — Transfer of Care (Signed)
Immediate Anesthesia Transfer of Care Note  Patient: Rebecca Howell  Procedure(s) Performed: Procedure(s): CESAREAN SECTION (N/A)  Patient Location: PACU  Anesthesia Type:Epidural  Level of Consciousness: awake, alert , oriented and patient cooperative  Airway & Oxygen Therapy: Patient Spontanous Breathing  Post-op Assessment: Report given to RN and Post -op Vital signs reviewed and stable  Post vital signs: Reviewed and stable  Last Vitals:  Vitals:   08/23/16 1728 08/23/16 1730  BP: 111/76 (!) 100/58  Pulse: 90 (!) 102  Resp:    Temp:      Last Pain:  Vitals:   08/23/16 1631  TempSrc: Oral  PainSc:          Complications: No apparent anesthesia complications

## 2016-08-23 NOTE — H&P (Signed)
Rebecca Howell is a 41 y.o. female G1P0 at 74 weeks 1 day presenting for elective IOL at term. She is AMA, prenatal course has been uncomplicated  OB History    Gravida Para Term Preterm AB Living   1             SAB TAB Ectopic Multiple Live Births                 Past Medical History:  Diagnosis Date  . Anxiety   . CIN I (cervical intraepithelial neoplasia I)   . CIN II (cervical intraepithelial neoplasia II)   . Depression    on meds  . Fever blister   . Fibroid   . PONV (postoperative nausea and vomiting)   . Vaginal Pap smear, abnormal    Past Surgical History:  Procedure Laterality Date  . APPENDECTOMY  2003  . MYOMECTOMY  2003   Family History: family history includes Breast cancer in her paternal grandmother; Colon cancer in her paternal grandfather; Healthy in her father, mother, and sister; Hypertension in her maternal grandfather, maternal grandmother, paternal grandfather, and paternal grandmother; Pancreatic cancer in her maternal grandmother. Social History:  reports that she has quit smoking. She has never used smokeless tobacco. She reports that she drinks about 8.4 oz of alcohol per week . She reports that she does not use drugs.     Maternal Diabetes: No Genetic Screening: Normal NIPT low risk female Maternal Ultrasounds/Referrals: Normal Fetal Ultrasounds or other Referrals:  Other: Normal anatomy scans Maternal Substance Abuse:  No Significant Maternal Medications:  None Significant Maternal Lab Results:  Lab values include: Group B Strep positive, Rh negative Other Comments:  None  Review of Systems  Constitutional: Negative.   Eyes: Negative for blurred vision and double vision.  Gastrointestinal: Negative for nausea.  All other systems reviewed and are negative.  Maternal Medical History:  Reason for admission: Nausea.  Contractions: Onset was 1-2 hours ago.   Frequency: rare.   Perceived severity is mild.    Fetal activity: Perceived fetal  activity is normal.   Last perceived fetal movement was within the past hour.    Prenatal complications: no prenatal complications Prenatal Complications - Diabetes: none.    Dilation: 1 Effacement (%): 60 Station: -1 Exam by:: Dr. Alwyn Pea Blood pressure 122/77, pulse 74, temperature 98 F (36.7 C), temperature source Oral, resp. rate 16, height 5' 3.5" (1.613 m), weight 80.8 kg (178 lb 3.2 oz), last menstrual period 11/17/2015. Maternal Exam:  Uterine Assessment: Contraction strength is mild.  Contraction frequency is irregular.   Abdomen: Patient reports no abdominal tenderness. Estimated fetal weight is 3000 grams.   Fetal presentation: vertex  Introitus: Normal vulva. Normal vagina.  Ferning test: not done.  Nitrazine test: not done. Amniotic fluid character: not assessed.  Pelvis: adequate for delivery.      Fetal Exam Fetal Monitor Review: Baseline rate: 135.  Variability: moderate (6-25 bpm).   Pattern: no decelerations and accelerations present.    Fetal State Assessment: Category I - tracings are normal.     Physical Exam  Nursing note and vitals reviewed.   Prenatal labs: ABO, Rh: --/--/A NEG (06/29 0100) Antibody: POS (06/29 0100) Rubella: Immune (12/19 0000) RPR: Nonreactive (12/19 0000)  HBsAg: Negative (12/19 0000)  HIV: Non-reactive (12/19 0000)  GBS: Positive (05/06 0000)   Assessment/Plan: 41 yo G1P0 SIUP at 39 weeks 1 day.  This was an infertility pregnancy Continuous monitoring Patient has received 2 misoprostol treatments overnight  Foley bulb placed, will start Pitocin Epidural on demand   Stacia Feazell, [place] 08/23/2016, 8:58 AM

## 2016-08-23 NOTE — Progress Notes (Signed)
dysfuctional contraction pattern noted.  Frequency from 1-3 minutes, with no resting time at times.  Uterine tone does not relax at times with inadequate resting time between ucs.  Position changed.

## 2016-08-23 NOTE — Op Note (Signed)
Cesarean Section Procedure Note  Indications: 39 week SIUP Non-reassuring fetal heart tracing Category III, Fetal intolerance of labor.   Pre-operative Diagnosis: 39 week 1 day pregnancy, Fetal intolerance of labor  Post-operative Diagnosis: same plus occiput posterior and suspected abruption  Procedure:  Primary cesarean delivery                         Surgeon: Caffie Damme, MD  Assistants: Maida Sale RNFA  Anesthesia: Epidural anesthesia  ASA Class: 2  Procedure Details   The patient was counseled about the risks, benefits, complications of the cesarean section. The patient concurred with the proposed plan, giving informed consent.  The site of surgery properly noted/marked. The patient was taken to Operating Room # 9, identified as Rebecca Howell  and the procedure verified as C-Section Delivery. A Time Out was held and the above information confirmed.  After epidural was found to adequate , the patient was placed in the dorsal supine position with a leftward tilt, draped and prepped in the usual sterile manner. A Pfannenstiel incision was made and carried down through the subcutaneous tissue to the fascia.  The fascia was incised in the midline and the fascial incision was extended laterally with Mayo scissors. The superior aspect of the fascial incision was grasped with Coker clamps x2, tented up and the rectus muscles dissected off sharply with the bovie.  The rectus was then dissected off with blunt dissection and Mayo scissors inferiorly. The rectus muscles were separated in the midline. The abdominal peritoneum was identified, tented up, entered sharply using Metzenbaum scissors, and the incision was extended superiorly and inferiorly with good visualization of the bladder. The Alexis retractor was then deployed. The vesicouterine peritoneum was identified, tented up, entered sharply with Metzenbaum scissors, and the bladder flap was created digitally. Scalpel was then used  to make a low transverse incision on the uterus which was extended laterally with  blunt dissection. The fetal vertex was identified (noted to be Right occiput posterior) Kiwi vacuum was placed and the head delivered followed by the shoulders and body through the incision.  The A live female infant was bulb suctioned on the operative field cried vigorously, cord was clamped and cut and the infant was passed to the waiting neonatologist. Apgars 8/9. Placenta was then delivered spontaneously, intact and appear normal, the uterus was cleared of all clot and debris. The uterine incision was repaired with #0 Monocryl in running locked fashion. A second imbricating suture was performed using the same suture. The incision was hemostatic. Ovaries and tubes were inspected and normal.  The Alexis retractor was removed. The abdominal cavity was cleared of all clot and debris. The abdominal peritoneum was reapproximated with 2-0 chromic  in a running fashion, the rectus muscles was reapproximated with #2 chromic in interrupted fashion. The fascia was closed with 0 Looped PDS in a running fashion. The subcuticular layer was irrigated and all bleeders cauterized.  30 mL of 0.25% Marcaine was injected into the subcutaneous layer.  The Scarpas fascia was re-approximated with interrupted sutures of 2-0 plain.  The skin was closed with 4-0 vicryl in a subcuticular fashion using a Lanny Hurst needle. The incision was dressed with benzoine, steri strips and pressure dressing. All sponge lap and needle counts were correct x3. Patient tolerated the procedure well and recovered in stable condition following the procedure.  Instrument, sponge, and needle counts were correct prior the abdominal closure and at the conclusion of the  case.   Findings: Live female infant, Apgars 8/9, clear amniotic fluid, large clot along posterior surface of placenta, normal uterus, small subserosal fibroid removed, bilateral tubes and ovaries  Estimated Blood  Loss:  700 mL  IVF:  1400 mL LR         Drains: Foley catheter  Urine output: 150 mL clear         Specimens: Placenta to Pathology; Cord gases; cord blood         Implants: none         Complications:  None; patient tolerated the procedure well.         Disposition: PACU - hemodynamically stable.   Rebecca Howell

## 2016-08-23 NOTE — Progress Notes (Signed)
Patient ID: Rebecca Howell, female   DOB: 1975-09-29, 41 y.o.   MRN: [id number] Requested to do bedside U/S to verify vtx presentation prior to starting pitocin.  Vtx verified by U/S.  Laury Deep, CNM 08/23/2016 10:07 AM

## 2016-08-24 ENCOUNTER — Encounter (HOSPITAL_COMMUNITY): Payer: Self-pay | Admitting: Obstetrics & Gynecology

## 2016-08-24 LAB — CBC
HCT: 31.9 % — ABNORMAL LOW (ref 36.0–46.0)
Hemoglobin: 10.8 g/dL — ABNORMAL LOW (ref 12.0–15.0)
MCH: 28.6 pg (ref 26.0–34.0)
MCHC: 33.9 g/dL (ref 30.0–36.0)
MCV: 84.6 fL (ref 78.0–100.0)
PLATELETS: 191 10*3/uL (ref 150–400)
RBC: 3.77 MIL/uL — AB (ref 3.87–5.11)
RDW: 14.6 % (ref 11.5–15.5)
WBC: 19.8 10*3/uL — AB (ref 4.0–10.5)

## 2016-08-24 MED ORDER — RHO D IMMUNE GLOBULIN 1500 UNIT/2ML IJ SOSY
300.0000 ug | PREFILLED_SYRINGE | Freq: Once | INTRAMUSCULAR | Status: AC
  Administered 2016-08-24: 300 ug via INTRAVENOUS
  Filled 2016-08-24: qty 2

## 2016-08-24 NOTE — Progress Notes (Signed)
Subjective: Postpartum Day 1: Cesarean Delivery Patient reports tolerating PO, + flatus and no problems voiding.    Objective: Vital signs in last 24 hours: Temp:  [97 F (36.1 C)-98.9 F (37.2 C)] 97.8 F (36.6 C) (06/30 0800) Pulse Rate:  [68-102] 77 (06/30 0800) Resp:  [16-18] 18 (06/30 0800) BP: (91-132)/(46-82) 105/68 (06/30 0800) SpO2:  [97 %-100 %] 99 % (06/30 0800) Weight:  [80.7 kg (178 lb)] 80.7 kg (178 lb) (06/30 0800)  Physical Exam:  General: alert, cooperative and no distress Lochia: appropriate Uterine Fundus: firm Incision: clean dry pressure dressing still on DVT Evaluation: No evidence of DVT seen on physical exam. Negative Homan's sign. No cords or calf tenderness.   Recent Labs  08/23/16 0100 08/24/16 0555  HGB 11.6* 10.8*  HCT 35.3* 31.9*    Assessment/Plan: Status post Cesarean section. Doing well postoperatively.  Continue current care. Patient may shower, counseled to take off pressure dressing in shower D/C IV fluids  Rebecca Howell, [place] 08/24/2016, 8:39 AM

## 2016-08-24 NOTE — Progress Notes (Signed)
MOB was referred for history of depression/anxiety.  Referral is screened out by Clinical Social Worker because none of the following criteria appear to apply and there are no reports impacting the pregnancy or her transition to the postpartum period.  CSW does not deem it clinically necessary to further investigate at this time.   -History of anxiety/depression during this pregnancy, or of post-partum depression. - Diagnosis of anxiety and/or depression within last 3 years - History of depression due to pregnancy loss/loss of child or -MOB's symptoms are currently being treated with medication and/or therapy (Has script for xanax).   CSW met with MOB at bedside to assess consult for MOB's hx of anxiety/depression. MOB notes she has been on xanax to treat; however, stopped during her pregnancy because she did not want to take it while pregnant. MOB notes she is doing fine currently and does not want to start back just yet. She wants to wait it out and see how she feels. CSW encouraged MOB to assess her emtional well being often and if she feels symptoms on setting to start back her current script. MOB verbalized understanding and expressed no further needs. Please contact the Clinical Social Worker if needs arise or upon MOB request.    Oda Cogan, MSW, [facility]  Office: [phone]

## 2016-08-24 NOTE — Lactation Note (Signed)
This note was copied from a baby's chart. Lactation Consultation Note  Patient Name: Rebecca Howell Date: 08/24/2016 Reason for consult: Initial assessment   Initial assessment with mom of 72 hour old infant at mom's request. Infant with 2 BF for 10 minutes, 1 attempt, 2 voids and 1 stool since birth. Infant weight 6 lb 13.2 oz with 2%. LATCH score 5-7. ABO incompatibility and maternal infertility noted.   Mom reports she was trying to latch infant and he was not interested in latching. Mom with large firm breasts with semi compressible areola and everted nipples. Mom is noted to have areolar edema. Mom reports her nipples are usually more erect. Discussed reverse pressure and using prior to latch. Mom was able to hand express 2 gtts colostrum that was put in infant mouth on gloved finger. Infant spit up a large amount clear mucous. He then began to root some. Assisted mom in latching infant to right breast in the cross cradle hold using the tea cup hold. Infant needing some stimulation to maintain suckling. Discussed awakening techniques and breast massage/compression. Multiple swallows were noted as feeding progressed. Infant still feeding when Emmet left room. Areolar edema resolved as infant fed on the right breast. Mom did very well with positioning and holding infant with feeding. Nipple was noted to be rounded when infant came off breast at one point during feeding, mom denied nipple pain or tenderness.   Enc mom to feed infant STS 8-12 xi n24 hours at first feeding cues offering both breasts with each feeding. Enc mom to use good head and pillow support and breast massage/compression with feeding. Enc mom to call out for feeding assistance as needed. BF Basics, positioning, hand expression, spoon feeding, colostrum, milk coming to volume, NB feeding behaviors, and NB nutritional needs reviewed with mom.   BF Resources handout and [place] Brochure given, mom informed of IP/OP Services, BF  Support Groups and [place] phone #. Enc mom to call out for feeding assistance as needed. Parents without further questions/concerns at this time.      Maternal Data Formula Feeding for Exclusion: No Has patient been taught Hand Expression?: Yes Does the patient have breastfeeding experience prior to this delivery?: No  Feeding Feeding Type: Breast Fed Length of feed: 15 min (still feeding when LC left room)  LATCH Score/Interventions Latch: Repeated attempts needed to sustain latch, nipple held in mouth throughout feeding, stimulation needed to elicit sucking reflex. Intervention(s): Adjust position;Assist with latch;Breast massage;Breast compression  Audible Swallowing: Spontaneous and intermittent Intervention(s): Skin to skin;Hand expression Intervention(s): Skin to skin;Hand expression;Alternate breast massage  Type of Nipple: Everted at rest and after stimulation (Areolar edema)  Comfort (Breast/Nipple): Soft / non-tender     Hold (Positioning): Assistance needed to correctly position infant at breast and maintain latch. Intervention(s): Breastfeeding basics reviewed;Support Pillows;Position options;Skin to skin  LATCH Score: 8  Lactation Tools Discussed/Used Tools: Pump Breast pump type: Manual WIC Program: No   Consult Status Consult Status: Follow-up Date: 08/25/16 Follow-up type: In-patient    Debby Freiberg Hice 08/24/2016, 10:22 AM

## 2016-08-24 NOTE — Anesthesia Postprocedure Evaluation (Signed)
Anesthesia Post Note  Patient: Rebecca Howell  Procedure(s) Performed: Procedure(s) (LRB): CESAREAN SECTION (N/A)     Patient location during evaluation: Mother Baby Anesthesia Type: Epidural Level of consciousness: awake, awake and alert, oriented and patient cooperative Pain management: pain level controlled Vital Signs Assessment: post-procedure vital signs reviewed and stable Respiratory status: spontaneous breathing, nonlabored ventilation and respiratory function stable Cardiovascular status: stable Postop Assessment: no headache, no backache, patient able to bend at knees and no signs of nausea or vomiting Anesthetic complications: no    Last Vitals:  Vitals:   08/23/16 2335 08/24/16 0420  BP: 116/70   Pulse: 68   Resp: 17 18  Temp: 36.6 C 36.8 C    Last Pain:  Vitals:   08/24/16 0420  TempSrc: Oral  PainSc: 2    Pain Goal:                 Ottilie Wigglesworth L

## 2016-08-25 LAB — RH IG WORKUP (INCLUDES ABO/RH)
ABO/RH(D): A NEG
Fetal Screen: NEGATIVE
GESTATIONAL AGE(WKS): 40
Unit division: 0

## 2016-08-25 MED ORDER — IBUPROFEN 600 MG PO TABS: 600.0000 mg | ORAL_TABLET | Freq: Four times a day (QID) | ORAL | 3 refills | Status: DC | PRN

## 2016-08-25 MED ORDER — OXYCODONE-ACETAMINOPHEN 5-325 MG PO TABS: 1.0000 | ORAL_TABLET | ORAL | 0 refills | Status: DC | PRN

## 2016-08-25 NOTE — Progress Notes (Signed)
Subjective: Postpartum Day 2: Cesarean Delivery Patient reports tolerating PO, + flatus and no problems voiding.   Pain well controlled, no CP, no SOB, no N/V/D  Objective: Vital signs in last 24 hours: Temp:  [97.7 F (36.5 C)-98.3 F (36.8 C)] 98 F (36.7 C) (07/01 0524) Pulse Rate:  [80-91] 90 (07/01 0524) Resp:  [16-18] 16 (07/01 0524) BP: (99-110)/(53-64) 99/57 (07/01 0524) SpO2:  [96 %-98 %] 97 % (07/01 0524)  Physical Exam:  General: alert, cooperative and no distress Lochia: appropriate Uterine Fundus: firm Incision: healing well, no significant drainage, no dehiscence, no significant erythema DVT Evaluation: No evidence of DVT seen on physical exam. Negative Homan's sign. No cords or calf tenderness. No significant calf/ankle edema.   Recent Labs  08/23/16 0100 08/24/16 0555  HGB 11.6* 10.8*  HCT 35.3* 31.9*    Assessment/Plan: Status post Cesarean section. Doing well postoperatively.  Discharge home with standard precautions Will f/u with me in office in one week for incision check Discussed precautions  [place], [place] 08/25/2016, 9:07 AM

## 2016-08-25 NOTE — Discharge Summary (Signed)
Obstetric Discharge Summary Reason for Admission: induction of labor Prenatal Procedures: NST and ultrasound Intrapartum Procedures: cesarean: low cervical, transverse Postpartum Procedures: none Complications-Operative and Postpartum: none Hemoglobin  Date Value Ref Range Status  08/24/2016 10.8 (L) 12.0 - 15.0 g/dL Final   HCT  Date Value Ref Range Status  08/24/2016 31.9 (L) 36.0 - 46.0 % Final    Physical Exam:  General: alert, cooperative and no distress Lochia: appropriate Uterine Fundus: firm Incision: healing well, no significant drainage, no dehiscence, no significant erythema DVT Evaluation: No evidence of DVT seen on physical exam. Negative Homan's sign. No cords or calf tenderness. No significant calf/ankle edema.   Discharge Diagnoses: Term Pregnancy-delivered  Discharge Information: Date: 08/25/2016 Activity: pelvic rest Diet: routine Medications: PNV, Ibuprofen and Percocet Condition: stable Instructions: refer to practice specific booklet Discharge to: home   Newborn Data: Live born female  Birth Weight: 6 lb 14.9 oz (3145 g) APGAR: 8, 9  Home with mother.  [place], [place] 08/25/2016, 9:11 AM

## 2016-08-25 NOTE — Lactation Note (Signed)
This note was copied from a baby's chart. Lactation Consultation Note  Patient Name: Rebecca Howell HALPF'X Date: 08/25/2016 Reason for consult: Follow-up assessment Mom and baby will be discharged today.  Baby is currently on the breast but latch is shallow.  Baby taken off and mom shown how to compress tissue well and bring baby to breast quickly.  Latch improved.  Baby sleepy at breast and needs much stimulation to stay nutritive.  Breast massage and compression also shown and encouraged during feeding.  Basic and discharge teaching done including engorgement treatment.  Questions answered.  Lactation outpatient services and support information reviewed and encouraged prn.  Maternal Data    Feeding Feeding Type: Breast Fed Length of feed: 30 min  LATCH Score/Interventions Latch: Grasps breast easily, tongue down, lips flanged, rhythmical sucking. Intervention(s): Adjust position;Assist with latch;Breast massage;Breast compression  Audible Swallowing: A few with stimulation Intervention(s): Alternate breast massage  Type of Nipple: Everted at rest and after stimulation  Comfort (Breast/Nipple): Soft / non-tender     Hold (Positioning): Assistance needed to correctly position infant at breast and maintain latch. Intervention(s): Breastfeeding basics reviewed;Support Pillows;Position options  LATCH Score: 8  Lactation Tools Discussed/Used     Consult Status Consult Status: Complete    [name] S 08/25/2016, 11:11 AM

## 2016-08-27 LAB — BPAM RBC
BLOOD PRODUCT EXPIRATION DATE: 201807262359
BLOOD PRODUCT EXPIRATION DATE: 201807272359
UNIT TYPE AND RH: 600
UNIT TYPE AND RH: 600

## 2016-08-27 LAB — TYPE AND SCREEN
ABO/RH(D): A NEG
ANTIBODY SCREEN: POSITIVE
DAT, IGG: NEGATIVE
Unit division: 0
Unit division: 0

## 2019-02-22 ENCOUNTER — Ambulatory Visit: Payer: Managed Care, Other (non HMO) | Attending: Internal Medicine

## 2019-02-22 DIAGNOSIS — U071 COVID-19: Secondary | ICD-10-CM

## 2019-02-22 DIAGNOSIS — R238 Other skin changes: Secondary | ICD-10-CM

## 2019-02-23 ENCOUNTER — Telehealth: Payer: Self-pay

## 2019-02-23 NOTE — Telephone Encounter (Signed)
Patient advise that result not back yet

## 2019-02-24 LAB — NOVEL CORONAVIRUS, NAA: SARS-CoV-2, NAA: NOT DETECTED

## 2022-05-08 ENCOUNTER — Ambulatory Visit (INDEPENDENT_AMBULATORY_CARE_PROVIDER_SITE_OTHER): Payer: 59 | Admitting: Adult Health

## 2022-05-08 ENCOUNTER — Encounter: Payer: Self-pay | Admitting: Adult Health

## 2022-05-08 VITALS — BP 127/88 | HR 88 | Ht 64.0 in | Wt 135.0 lb

## 2022-05-08 DIAGNOSIS — F900 Attention-deficit hyperactivity disorder, predominantly inattentive type: Secondary | ICD-10-CM

## 2022-05-08 DIAGNOSIS — F411 Generalized anxiety disorder: Secondary | ICD-10-CM

## 2022-05-08 MED ORDER — AMPHETAMINE-DEXTROAMPHETAMINE 20 MG PO TABS: 20.0000 mg | ORAL_TABLET | Freq: Every day | ORAL | 0 refills | Status: DC

## 2022-05-08 MED ORDER — SERTRALINE HCL 25 MG PO TABS: 25.0000 mg | ORAL_TABLET | Freq: Every day | ORAL | 1 refills | Status: DC

## 2022-05-08 MED ORDER — SERTRALINE HCL 50 MG PO TABS: 50.0000 mg | ORAL_TABLET | Freq: Every day | ORAL | 1 refills | Status: DC

## 2022-05-08 NOTE — Progress Notes (Signed)
[facility] MD/PA/NP Initial Note  05/08/2022 9:13 AM Rebecca Howell  MRN:  [id number]  Chief Complaint:   HPI:   Describes mood today as "ok". Pleasant. Denies tearfulness. Mood symptoms - reports some depression, anxiety, and irritability - "nothing unmanageable". Reports worry and over thinking. Has issues getting started on projects. Likes to have things be organized, but can't follow through. Stating "I know what I need to do, but have issues getting started". Mood is consistent. Currently taking Zoloft '75mg'$  daily.Stating "I feel like my mood is mostly stable". Working with therapist x 4 years. Reports current concerns around attention issues. Symptoms present since late teens, and has progressively worsened into adulthood. Has completed screening tools which also indicated a positive diagnosis. Would like to consider options to help manage the symptoms. Stable interest and motivation. Taking medications as prescribed.  Energy levels stable. Active, does not have a regular exercise routine.    Enjoys some usual interests and activities. Married x 18 years - 1 son age 45 - 2 dogs. Family local. Spending time with family. Appetite adequate. Weight stable - 135 pounds - 64". Sleeps well most nights. Averages 6 to 7 hours - doesn't feel rested. Has a difficult time getting to sleep. Does not use sleep aids. Focus and concentration difficulties. Reports procrastination - waiting to the last minute to get things done - putting things off. Completing tasks. Managing aspects of household. 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  Denies SI or HI.  Denies AH or VH. Denies self harm. Denies substance use.  Previous medication trials:  Sertraline, Paxil, Xanax  Visit Diagnosis:    ICD-10-CM   1. Attention deficit hyperactivity disorder (ADHD), predominantly inattentive type  F90.0 amphetamine-dextroamphetamine (ADDERALL) 20 MG tablet    2. Generalized anxiety disorder  F41.1 sertraline (ZOLOFT) 50 MG tablet     sertraline (ZOLOFT) 25 MG tablet      Past Psychiatric History: Denies psychiatric hospitalization.   Past Medical History:  Past Medical History:  Diagnosis Date   Anxiety    CIN I (cervical intraepithelial neoplasia I)    CIN II (cervical intraepithelial neoplasia II)    Depression    on meds   Fever blister    Fibroid    PONV (postoperative nausea and vomiting)    Vaginal Pap smear, abnormal     Past Surgical History:  Procedure Laterality Date   APPENDECTOMY  2003   CESAREAN SECTION N/A 08/23/2016   Procedure: CESAREAN SECTION;  Surgeon: Sanjuana Kava, MD;  Location: [facility];  Service: Obstetrics;  Laterality: N/A;   MYOMECTOMY  2003    Family Psychiatric History: Family history of mental illness.   Family History:  Family History  Problem Relation Age of Onset   Pancreatic cancer Maternal Grandmother    Hypertension Maternal Grandmother    Hypertension Maternal Grandfather    Breast cancer Paternal Grandmother        Age 14's   Hypertension Paternal Grandmother    Colon cancer Paternal Grandfather    Hypertension Paternal Grandfather    Healthy Mother    Healthy Father    Healthy Sister     Social History:  Social History   Socioeconomic History   Marital status: Married    Spouse name: Not on file   Number of children: Not on file   Years of education: Not on file   Highest education level: Not on file  Occupational History   Not on file  Tobacco Use   Smoking status:  Former   Smokeless tobacco: Never  Substance and Sexual Activity   Alcohol use: Yes    Alcohol/week: 14.0 standard drinks of alcohol    Types: 14 Standard drinks or equivalent per week    Comment: 10-15 drinks per week.  1-2 glasses wine nightly   Drug use: No   Sexual activity: Yes    Birth control/protection: Condom  Other Topics Concern   Not on file  Social History Narrative   Lives with husband in a one story house.  Has no children.     Works as a Medical sales representative.     Education: college   Social Determinants of Radio broadcast assistant Strain: Not on file  Food Insecurity: Not on file  Transportation Needs: Not on file  Physical Activity: Not on file  Stress: Not on file  Social Connections: Not on file    Allergies: No Known Allergies  Metabolic Disorder Labs: No results found for: "HGBA1C", "MPG" No results found for: "PROLACTIN" Lab Results  Component Value Date   CHOL 164 09/29/2012   TRIG 384 (H) 09/29/2012   HDL 68 09/29/2012   CHOLHDL 2.4 09/29/2012   VLDL 77 (H) 09/29/2012   LDLCALC 19 09/29/2012   Lab Results  Component Value Date   TSH 1.662 09/29/2012    Therapeutic Level Labs: No results found for: "LITHIUM" No results found for: "VALPROATE" No results found for: "CBMZ"  Current Medications: Current Outpatient Medications  Medication Sig Dispense Refill   amphetamine-dextroamphetamine (ADDERALL) 20 MG tablet Take 1 tablet (20 mg total) by mouth daily. 30 tablet 0   sertraline (ZOLOFT) 25 MG tablet Take 1 tablet (25 mg total) by mouth daily. 90 tablet 1   sertraline (ZOLOFT) 50 MG tablet Take 1 tablet (50 mg total) by mouth daily. 90 tablet 1   No current facility-administered medications for this visit.    Medication Side Effects: none  Orders placed this visit:  No orders of the defined types were placed in this encounter.   Psychiatric Specialty Exam:  Review of Systems  Musculoskeletal:  Negative for gait problem.  Neurological:  Negative for tremors.  Psychiatric/Behavioral:         Please refer to HPI    unknown if currently breastfeeding.There is no height or weight on file to calculate BMI.  General Appearance: Casual and Neat  Eye Contact:  Good  Speech:  Clear and Coherent and Normal Rate  Volume:  Normal  Mood:  Euthymic  Affect:  Appropriate and Congruent  Thought Process:  Coherent and Descriptions of Associations: Intact  Orientation:  Full (Time, Place, and Person)   Thought Content: Logical   Suicidal Thoughts:  No  Homicidal Thoughts:  No  Memory:  WNL  Judgement:  Good  Insight:  Good  Psychomotor Activity:  Normal  Concentration:  Concentration: Good and Attention Span: Good  Recall:  Good  Fund of Knowledge: Good  Language: Good  Assets:  Communication Skills Desire for Improvement Financial Resources/Insurance Housing Intimacy Leisure Time Physical Health Resilience Social Support Talents/Skills Transportation Vocational/Educational  ADL's:  Intact  Cognition: WNL  Prognosis:  Good   Screenings: MDQ  Receiving Psychotherapy: No   Treatment Plan/Recommendations:   Plan:  PDMP reviewed  Continue Zoloft '75mg'$  daily Add Adderall '20mg'$  daily  ADD screening tool completed. Meets DSM-5 criteria for diagnosis of ADD. Psych Central scoring indicates 46/58 - ADD likely.  127/98/88  RTC 4 weeks  Patient advised to contact office with any  questions, adverse effects, or acute worsening in signs and symptoms.   Discussed potential benefits, risks, and side effects of stimulants with patient to include increased heart rate, palpitations, insomnia, increased anxiety, increased irritability, or decreased appetite.  Instructed patient to contact office if experiencing any significant tolerability issues.    Aloha Gell, NP

## 2022-06-01 ENCOUNTER — Other Ambulatory Visit: Payer: Self-pay | Admitting: Adult Health

## 2022-06-01 DIAGNOSIS — F411 Generalized anxiety disorder: Secondary | ICD-10-CM

## 2022-06-05 ENCOUNTER — Other Ambulatory Visit: Payer: Self-pay | Admitting: Adult Health

## 2022-06-05 ENCOUNTER — Ambulatory Visit: Payer: 59 | Admitting: Adult Health

## 2022-06-05 ENCOUNTER — Telehealth: Payer: Self-pay | Admitting: Adult Health

## 2022-06-05 DIAGNOSIS — F900 Attention-deficit hyperactivity disorder, predominantly inattentive type: Secondary | ICD-10-CM

## 2022-06-05 MED ORDER — AMPHETAMINE-DEXTROAMPHETAMINE 20 MG PO TABS: 20.0000 mg | ORAL_TABLET | Freq: Every day | ORAL | 0 refills | Status: DC

## 2022-06-05 NOTE — Telephone Encounter (Signed)
Rebecca Howell is requesting a refill on her Adderall 20 mg rapid release. Pharmacy is:  [id number] - [place], [place] - [address] [place] [address]   Phone: [phone]  Fax: [phone]    It is in stock.

## 2022-06-05 NOTE — Telephone Encounter (Signed)
Sent to me by mistake 

## 2022-06-19 ENCOUNTER — Ambulatory Visit (INDEPENDENT_AMBULATORY_CARE_PROVIDER_SITE_OTHER): Payer: 59 | Admitting: Adult Health

## 2022-06-19 ENCOUNTER — Encounter: Payer: Self-pay | Admitting: Adult Health

## 2022-06-19 DIAGNOSIS — F900 Attention-deficit hyperactivity disorder, predominantly inattentive type: Secondary | ICD-10-CM | POA: Diagnosis not present

## 2022-06-19 DIAGNOSIS — F411 Generalized anxiety disorder: Secondary | ICD-10-CM

## 2022-06-19 MED ORDER — AMPHETAMINE-DEXTROAMPHET ER 30 MG PO CP24: 30.0000 mg | ORAL_CAPSULE | Freq: Every day | ORAL | 0 refills | Status: DC

## 2022-06-19 NOTE — Progress Notes (Signed)
Rebecca Howell [id number] 1976/02/20 47 y.o.  Subjective:   Patient ID:  Rebecca Howell is a 47 y.o. (DOB 24-Sep-1975) female.  Chief Complaint: No chief complaint on file.   HPI Rebecca Howell presents to the office today for follow-up of ADD and GAD.  Describes mood today as "ok". Pleasant. Denies tearfulness. Mood symptoms - denies depression, anxiety, and irritability. Reports decreased worry and over thinking. Has been able to get caught up on work tasks. Getting things accomplished. Not feeling as overwhelmed. Stating "it feels good to get things marked off". Mood is consistent. Would like to try the Adderall XR formula this month for more consistency throughout the day. Stable interest and motivation. Taking medications as prescribed.  Energy levels stable. Active, does not have a regular exercise routine.    Enjoys some usual interests and activities. Married x 18 years - 1 son age 35 - 2 dogs. Family local. Spending time with family. Appetite adequate. Weight stable - 135 pounds - 64". Sleeps well most nights. Averages 6 to 7 hours - improved. Focus and concentration improved "some". Reports decreased procrastination - trying to make lists and stick to it. Completing tasks. Managing aspects of household. 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  Denies SI or HI.  Denies AH or VH. Denies self harm. Denies substance use.  Seeing therapist.  Previous medication trials:  Sertraline, Paxil, Xanax    Review of Systems:  Review of Systems  Musculoskeletal:  Negative for gait problem.  Neurological:  Negative for tremors.  Psychiatric/Behavioral:         Please refer to HPI    Medications: I have reviewed the patient's current medications.  Current Outpatient Medications  Medication Sig Dispense Refill   amphetamine-dextroamphetamine (ADDERALL XR) 30 MG 24 hr capsule Take 1 capsule (30 mg total) by mouth daily. 30 capsule 0   sertraline (ZOLOFT) 25 MG tablet Take 1 tablet (25 mg total) by  mouth daily. 90 tablet 1   sertraline (ZOLOFT) 50 MG tablet Take 1 tablet (50 mg total) by mouth daily. 90 tablet 1   No current facility-administered medications for this visit.    Medication Side Effects: None  Allergies: No Known Allergies  Past Medical History:  Diagnosis Date   Anxiety    CIN I (cervical intraepithelial neoplasia I)    CIN II (cervical intraepithelial neoplasia II)    Depression    on meds   Fever blister    Fibroid    PONV (postoperative nausea and vomiting)    Vaginal Pap smear, abnormal     Past Medical History, Surgical history, Social history, and Family history were reviewed and updated as appropriate.   Please see review of systems for further details on the patient's review from today.   Objective:   Physical Exam:  There were no vitals taken for this visit.  Physical Exam Constitutional:      General: She is not in acute distress. Musculoskeletal:        General: No deformity.  Neurological:     Mental Status: She is alert and oriented to person, place, and time.     Coordination: Coordination normal.  Psychiatric:        Attention and Perception: Attention and perception normal. She does not perceive auditory or visual hallucinations.        Mood and Affect: Mood normal. Mood is not anxious or depressed. Affect is not labile, blunt, angry or inappropriate.        Speech: Speech normal.  Behavior: Behavior normal.        Thought Content: Thought content normal. Thought content is not paranoid or delusional. Thought content does not include homicidal or suicidal ideation. Thought content does not include homicidal or suicidal plan.        Cognition and Memory: Cognition and memory normal.        Judgment: Judgment normal.     Comments: Insight intact     Lab Review:     Component Value Date/Time   NA 137 11/14/2014 0605   K 3.7 11/14/2014 0605   CL 106 11/14/2014 0605   CO2 24 11/14/2014 0605   GLUCOSE 110 (H) 11/14/2014  0605   BUN <5 (L) 11/14/2014 0605   CREATININE 0.60 11/14/2014 0605   CREATININE 0.81 09/29/2012 1152   CALCIUM 7.9 (L) 11/14/2014 0605   PROT 6.6 09/29/2012 1152   ALBUMIN 4.3 09/29/2012 1152   AST 28 09/29/2012 1152   ALT 19 09/29/2012 1152   ALKPHOS 54 09/29/2012 1152   BILITOT 0.9 09/29/2012 1152   GFRNONAA >60 11/14/2014 0605   GFRAA >60 11/14/2014 0605       Component Value Date/Time   WBC 19.8 (H) 08/24/2016 0555   RBC 3.77 (L) 08/24/2016 0555   HGB 10.8 (L) 08/24/2016 0555   HCT 31.9 (L) 08/24/2016 0555   PLT 191 08/24/2016 0555   MCV 84.6 08/24/2016 0555   MCH 28.6 08/24/2016 0555   MCHC 33.9 08/24/2016 0555   RDW 14.6 08/24/2016 0555   LYMPHSABS 1.4 09/29/2012 1152   MONOABS 0.4 09/29/2012 1152   EOSABS 0.2 09/29/2012 1152   BASOSABS 0.0 09/29/2012 1152    No results found for: "POCLITH", "LITHIUM"   No results found for: "PHENYTOIN", "PHENOBARB", "VALPROATE", "CBMZ"   .res Assessment: Plan:    Plan:  PDMP reviewed  Continue Zoloft  daily  D/C Adderall  daily  Add Adderall XR every morning  ADD screening tool completed. Meets DSM-5 criteria for diagnosis of ADD. Psych Central scoring indicates 46/58 - ADD likely.  Monitor BP between visits while taking stimulant medication.   RTC 4 weeks  Patient advised to contact office with any questions, adverse effects, or acute worsening in signs and symptoms.   Discussed potential benefits, risks, and side effects of stimulants with patient to include increased heart rate, palpitations, insomnia, increased anxiety, increased irritability, or decreased appetite.  Instructed patient to contact office if experiencing any significant tolerability issues.   Diagnoses and all orders for this visit:  Attention deficit hyperactivity disorder (ADHD), predominantly inattentive type -     amphetamine-dextroamphetamine (ADDERALL XR) 30 MG 24 hr capsule; Take 1 capsule (30 mg total) by mouth  daily.  Generalized anxiety disorder     Please see After Visit Summary for patient specific instructions.  No future appointments.  No orders of the defined types were placed in this encounter.   -------------------------------

## 2022-07-17 ENCOUNTER — Encounter: Payer: Self-pay | Admitting: Adult Health

## 2022-07-17 ENCOUNTER — Telehealth (INDEPENDENT_AMBULATORY_CARE_PROVIDER_SITE_OTHER): Payer: 59 | Admitting: Adult Health

## 2022-07-17 DIAGNOSIS — F900 Attention-deficit hyperactivity disorder, predominantly inattentive type: Secondary | ICD-10-CM | POA: Diagnosis not present

## 2022-07-17 DIAGNOSIS — F411 Generalized anxiety disorder: Secondary | ICD-10-CM

## 2022-07-17 MED ORDER — AMPHETAMINE-DEXTROAMPHETAMINE 5 MG PO TABS: 5.0000 mg | ORAL_TABLET | Freq: Every day | ORAL | 0 refills | Status: DC

## 2022-07-17 MED ORDER — AMPHETAMINE-DEXTROAMPHET ER 30 MG PO CP24: 30.0000 mg | ORAL_CAPSULE | Freq: Every day | ORAL | 0 refills | Status: DC

## 2022-07-17 NOTE — Progress Notes (Signed)
Rebecca Howell [id number] Oct 20, 1975 47 y.o.  Virtual Visit via Video Note  I connected with pt @ on 07/17/22 at  1:00 PM EDT by a video enabled telemedicine application and verified that I am speaking with the correct person using two identifiers.   I discussed the limitations of evaluation and management by telemedicine and the availability of in person appointments. The patient expressed understanding and agreed to proceed.  I discussed the assessment and treatment plan with the patient. The patient was provided an opportunity to ask questions and all were answered. The patient agreed with the plan and demonstrated an understanding of the instructions.   The patient was advised to call back or seek an in-person evaluation if the symptoms worsen or if the condition fails to improve as anticipated.  I provided 10 minutes of non-face-to-face time during this encounter.  The patient was located at home.  The provider was located at [facility].   Dorothyann Gibbs, NP   Subjective:   Patient ID:  Rebecca Howell is a 47 y.o. (DOB [birth year]) female.  Chief Complaint: No chief complaint on file.   HPI Rebecca Howell presents for follow-up of ADD and GAD.  Describes mood today as "ok". Pleasant. Denies tearfulness. Mood symptoms - denies depression, anxiety, and irritability. Denies worry and over thinking. Feeling less overwhelmed. Mood is consistent. Feels like the Adderall XR formula is working better for her - not lasting throughout her day. Willing to consider other options. Stable interest and motivation. Taking medications as prescribed.  Energy levels stable. Active, does not have a regular exercise routine.    Enjoys some usual interests and activities. Married x 18 years - 1 son age 16 - 2 dogs. Family local. Spending time with family. Appetite adequate. Weight stable - 135 pounds - 64". Sleeps well most nights. Averages 6 to 7 hours. Focus and concentration improved.  Decreased procrastination. Completing tasks. Managing aspects of household. 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  Denies SI or HI.  Denies AH or VH. Denies self harm. Denies substance use.  Seeing therapist.  Previous medication trials:  Sertraline, Paxil, Xanax   Review of Systems:  Review of Systems  Musculoskeletal:  Negative for gait problem.  Neurological:  Negative for tremors.  Psychiatric/Behavioral:         Please refer to HPI    Medications: I have reviewed the patient's current medications.  Current Outpatient Medications  Medication Sig Dispense Refill   amphetamine-dextroamphetamine (ADDERALL) 5 MG tablet Take 1 tablet (5 mg total) by mouth daily. 30 tablet 0   amphetamine-dextroamphetamine (ADDERALL XR) 30 MG 24 hr capsule Take 1 capsule (30 mg total) by mouth daily. 30 capsule 0   sertraline (ZOLOFT) 25 MG tablet Take 1 tablet (25 mg total) by mouth daily. 90 tablet 1   sertraline (ZOLOFT) 50 MG tablet Take 1 tablet (50 mg total) by mouth daily. 90 tablet 1   No current facility-administered medications for this visit.    Medication Side Effects: None  Allergies: No Known Allergies  Past Medical History:  Diagnosis Date   Anxiety    CIN I (cervical intraepithelial neoplasia I)    CIN II (cervical intraepithelial neoplasia II)    Depression    on meds   Fever blister    Fibroid    PONV (postoperative nausea and vomiting)    Vaginal Pap smear, abnormal     Family History  Problem Relation Age of Onset   Pancreatic cancer Maternal Grandmother  Hypertension Maternal Grandmother    Hypertension Maternal Grandfather    Breast cancer Paternal Grandmother        Age 78's   Hypertension Paternal Grandmother    Colon cancer Paternal Grandfather    Hypertension Paternal Grandfather    Healthy Mother    Healthy Father    Healthy Sister     Social History   Socioeconomic History   Marital status: Married    Spouse name: Not on file   Number of  children: Not on file   Years of education: Not on file   Highest education level: Not on file  Occupational History   Not on file  Tobacco Use   Smoking status: Former   Smokeless tobacco: Never  Substance and Sexual Activity   Alcohol use: Yes    Alcohol/week: 14.0 standard drinks of alcohol    Types: 14 Standard drinks or equivalent per week    Comment: 10-15 drinks per week.  1-2 glasses wine nightly   Drug use: No   Sexual activity: Yes    Birth control/protection: Condom  Other Topics Concern   Not on file  Social History Narrative   Lives with husband in a one story house.  Has no children.     Works as a Customer service manager.     Education: college   Social Determinants of Corporate investment banker Strain: Not on file  Food Insecurity: Not on file  Transportation Needs: Not on file  Physical Activity: Not on file  Stress: Not on file  Social Connections: Not on file  Intimate Partner Violence: Not on file    Past Medical History, Surgical history, Social history, and Family history were reviewed and updated as appropriate.   Please see review of systems for further details on the patient's review from today.   Objective:   Physical Exam:  There were no vitals taken for this visit.  Physical Exam Constitutional:      General: She is not in acute distress. Musculoskeletal:        General: No deformity.  Neurological:     Mental Status: She is alert and oriented to person, place, and time.     Coordination: Coordination normal.  Psychiatric:        Attention and Perception: Attention and perception normal. She does not perceive auditory or visual hallucinations.        Mood and Affect: Mood normal. Mood is not anxious or depressed. Affect is not labile, blunt, angry or inappropriate.        Speech: Speech normal.        Behavior: Behavior normal.        Thought Content: Thought content normal. Thought content is not paranoid or delusional. Thought content  does not include homicidal or suicidal ideation. Thought content does not include homicidal or suicidal plan.        Cognition and Memory: Cognition and memory normal.        Judgment: Judgment normal.     Comments: Insight intact     Lab Review:     Component Value Date/Time   NA 137 11/14/2014 0605   K 3.7 11/14/2014 0605   CL 106 11/14/2014 0605   CO2 24 11/14/2014 0605   GLUCOSE 110 (H) 11/14/2014 0605   BUN <5 (L) 11/14/2014 0605   CREATININE 0.60 11/14/2014 0605   CREATININE 0.81 09/29/2012 1152   CALCIUM 7.9 (L) 11/14/2014 0605   PROT 6.6 09/29/2012 1152   ALBUMIN  4.3 09/29/2012 1152   AST 28 09/29/2012 1152   ALT 19 09/29/2012 1152   ALKPHOS 54 09/29/2012 1152   BILITOT 0.9 09/29/2012 1152   GFRNONAA >60 11/14/2014 0605   GFRAA >60 11/14/2014 0605       Component Value Date/Time   WBC 19.8 (H) 08/24/2016 0555   RBC 3.77 (L) 08/24/2016 0555   HGB 10.8 (L) 08/24/2016 0555   HCT 31.9 (L) 08/24/2016 0555   PLT 191 08/24/2016 0555   MCV 84.6 08/24/2016 0555   MCH 28.6 08/24/2016 0555   MCHC 33.9 08/24/2016 0555   RDW 14.6 08/24/2016 0555   LYMPHSABS 1.4 09/29/2012 1152   MONOABS 0.4 09/29/2012 1152   EOSABS 0.2 09/29/2012 1152   BASOSABS 0.0 09/29/2012 1152    No results found for: "POCLITH", "LITHIUM"   No results found for: "PHENYTOIN", "PHENOBARB", "VALPROATE", "CBMZ"   .res Assessment: Plan:    Plan:  PDMP reviewed  Zoloft 75mg  daily Adderall XR 30mg every morning  Add Adderall 5mg  in the afternoon.  ADD screening tool completed. Meets DSM-5 criteria for diagnosis of ADD. Psych Central scoring indicates 46/58 - ADD likely.  Monitor BP between visits while taking stimulant medication.   RTC 4 weeks  Patient advised to contact office with any questions, adverse effects, or acute worsening in signs and symptoms.   Discussed potential benefits, risks, and side effects of stimulants with patient to include increased heart rate, palpitations,  insomnia, increased anxiety, increased irritability, or decreased appetite.  Instructed patient to contact office if experiencing any significant tolerability issues.   Diagnoses and all orders for this visit:  Generalized anxiety disorder  Attention deficit hyperactivity disorder (ADHD), predominantly inattentive type -     amphetamine-dextroamphetamine (ADDERALL XR) 30 MG 24 hr capsule; Take 1 capsule (30 mg total) by mouth daily. -     amphetamine-dextroamphetamine (ADDERALL) 5 MG tablet; Take 1 tablet (5 mg total) by mouth daily.     Please see After Visit Summary for patient specific instructions.  No future appointments.   No orders of the defined types were placed in this encounter.     -------------------------------

## 2022-08-28 ENCOUNTER — Other Ambulatory Visit: Payer: Self-pay

## 2022-08-28 ENCOUNTER — Telehealth: Payer: Self-pay | Admitting: Adult Health

## 2022-08-28 DIAGNOSIS — F900 Attention-deficit hyperactivity disorder, predominantly inattentive type: Secondary | ICD-10-CM

## 2022-08-28 MED ORDER — AMPHETAMINE-DEXTROAMPHETAMINE 5 MG PO TABS: 5.0000 mg | ORAL_TABLET | Freq: Every day | ORAL | 0 refills | Status: DC

## 2022-08-28 MED ORDER — AMPHETAMINE-DEXTROAMPHET ER 30 MG PO CP24: 30.0000 mg | ORAL_CAPSULE | Freq: Every day | ORAL | 0 refills | Status: DC

## 2022-08-28 NOTE — Telephone Encounter (Signed)
Pt LVM on 7/2 at 4:10p requesting refill of Adderall XR 30mg  and Adderall 5mg  to   [id number] - [place], [place] - [address] [place], [place] [address] Phone: [phone]  Fax: [phone]   Next appt 7/17

## 2022-08-28 NOTE — Telephone Encounter (Signed)
Pended.

## 2022-09-11 ENCOUNTER — Encounter: Payer: Self-pay | Admitting: Adult Health

## 2022-09-11 ENCOUNTER — Telehealth (INDEPENDENT_AMBULATORY_CARE_PROVIDER_SITE_OTHER): Payer: 59 | Admitting: Adult Health

## 2022-09-11 DIAGNOSIS — F900 Attention-deficit hyperactivity disorder, predominantly inattentive type: Secondary | ICD-10-CM

## 2022-09-11 DIAGNOSIS — F411 Generalized anxiety disorder: Secondary | ICD-10-CM

## 2022-09-11 MED ORDER — SERTRALINE HCL 25 MG PO TABS: 25.0000 mg | ORAL_TABLET | Freq: Every day | ORAL | 1 refills | Status: DC

## 2022-09-11 MED ORDER — AMPHETAMINE-DEXTROAMPHETAMINE 5 MG PO TABS: 5.0000 mg | ORAL_TABLET | Freq: Every day | ORAL | 0 refills | Status: DC

## 2022-09-11 MED ORDER — AMPHETAMINE-DEXTROAMPHET ER 30 MG PO CP24: 30.0000 mg | ORAL_CAPSULE | Freq: Every day | ORAL | 0 refills | Status: DC

## 2022-09-11 MED ORDER — SERTRALINE HCL 50 MG PO TABS: 50.0000 mg | ORAL_TABLET | Freq: Every day | ORAL | 1 refills | Status: DC

## 2022-09-11 NOTE — Progress Notes (Signed)
Rebecca Howell [id number] 1975/03/28 47 y.o.  Virtual Visit via Video Note  I connected with pt @ on 09/11/22 at  9:40 AM EDT by a video enabled telemedicine application and verified that I am speaking with the correct person using two identifiers.   I discussed the limitations of evaluation and management by telemedicine and the availability of in person appointments. The patient expressed understanding and agreed to proceed.  I discussed the assessment and treatment plan with the patient. The patient was provided an opportunity to ask questions and all were answered. The patient agreed with the plan and demonstrated an understanding of the instructions.   The patient was advised to call back or seek an in-person evaluation if the symptoms worsen or if the condition fails to improve as anticipated.  I provided 10 minutes of non-face-to-face time during this encounter.  The patient was located at home.  The provider was located at [facility].   Dorothyann Gibbs, NP   Subjective:   Patient ID:  Rebecca Howell is a 47 y.o. (DOB 30-Apr-1975) female.  Chief Complaint: No chief complaint on file.   HPI Rebecca Howell presents for follow-up of ADD and GAD.  Describes mood today as "ok". Pleasant. Denies tearfulness. Mood symptoms - denies depression, anxiety, and irritability. Denies panic attacks. Report decreased worry and over thinking. Feeling less overwhelmed. Mood is consistent. Stating "I feel like my head is more clear". Feels like current medication works well. Stable interest and motivation. Taking medications as prescribed.  Energy levels stable. Active, does not have a regular exercise routine.    Enjoys some usual interests and activities. Married x 18 years - 1 son age 86 - 2 dogs. Family local. Spending time with family. Appetite adequate. Weight stable - 135 pounds - 64". Sleeps well most nights. Averages 6 to 7 hours. Focus and concentration improved. Completing tasks.  Managing aspects of household. 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  Denies SI or HI.  Denies AH or VH. Denies self harm. Denies substance use.  Seeing therapist.  Previous medication trials:  Sertraline, Paxil, Xanax  Review of Systems:  Review of Systems  Musculoskeletal:  Negative for gait problem.  Neurological:  Negative for tremors.  Psychiatric/Behavioral:         Please refer to HPI    Medications: I have reviewed the patient's current medications.  Current Outpatient Medications  Medication Sig Dispense Refill   amphetamine-dextroamphetamine (ADDERALL XR) 30 MG 24 hr capsule Take 1 capsule (30 mg total) by mouth daily. 30 capsule 0   amphetamine-dextroamphetamine (ADDERALL) 5 MG tablet Take 1 tablet (5 mg total) by mouth daily. 30 tablet 0   sertraline (ZOLOFT) 25 MG tablet Take 1 tablet (25 mg total) by mouth daily. 90 tablet 1   sertraline (ZOLOFT) 50 MG tablet Take 1 tablet (50 mg total) by mouth daily. 90 tablet 1   No current facility-administered medications for this visit.    Medication Side Effects: None  Allergies: No Known Allergies  Past Medical History:  Diagnosis Date   Anxiety    CIN I (cervical intraepithelial neoplasia I)    CIN II (cervical intraepithelial neoplasia II)    Depression    on meds   Fever blister    Fibroid    PONV (postoperative nausea and vomiting)    Vaginal Pap smear, abnormal     Family History  Problem Relation Age of Onset   Pancreatic cancer Maternal Grandmother    Hypertension Maternal Grandmother  Hypertension Maternal Grandfather    Breast cancer Paternal Grandmother        Age 22's   Hypertension Paternal Grandmother    Colon cancer Paternal Grandfather    Hypertension Paternal Grandfather    Healthy Mother    Healthy Father    Healthy Sister     Social History   Socioeconomic History   Marital status: Married    Spouse name: Not on file   Number of children: Not on file   Years of education: Not on  file   Highest education level: Not on file  Occupational History   Not on file  Tobacco Use   Smoking status: Former   Smokeless tobacco: Never  Substance and Sexual Activity   Alcohol use: Yes    Alcohol/week: 14.0 standard drinks of alcohol    Types: 14 Standard drinks or equivalent per week    Comment: 10-15 drinks per week.  1-2 glasses wine nightly   Drug use: No   Sexual activity: Yes    Birth control/protection: Condom  Other Topics Concern   Not on file  Social History Narrative   Lives with husband in a one story house.  Has no children.     Works as a Customer service manager.     Education: college   Social Determinants of Corporate investment banker Strain: Not on file  Food Insecurity: Not on file  Transportation Needs: Not on file  Physical Activity: Not on file  Stress: Not on file  Social Connections: Not on file  Intimate Partner Violence: Not on file    Past Medical History, Surgical history, Social history, and Family history were reviewed and updated as appropriate.   Please see review of systems for further details on the patient's review from today.   Objective:   Physical Exam:  There were no vitals taken for this visit.  Physical Exam Constitutional:      General: She is not in acute distress. Musculoskeletal:        General: No deformity.  Neurological:     Mental Status: She is alert and oriented to person, place, and time.     Coordination: Coordination normal.  Psychiatric:        Attention and Perception: Attention and perception normal. She does not perceive auditory or visual hallucinations.        Mood and Affect: Affect is not labile, blunt, angry or inappropriate.        Speech: Speech normal.        Behavior: Behavior normal.        Thought Content: Thought content normal. Thought content is not paranoid or delusional. Thought content does not include homicidal or suicidal ideation. Thought content does not include homicidal or  suicidal plan.        Cognition and Memory: Cognition and memory normal.        Judgment: Judgment normal.     Comments: Insight intact     Lab Review:     Component Value Date/Time   NA 137 11/14/2014 0605   K 3.7 11/14/2014 0605   CL 106 11/14/2014 0605   CO2 24 11/14/2014 0605   GLUCOSE 110 (H) 11/14/2014 0605   BUN <5 (L) 11/14/2014 0605   CREATININE 0.60 11/14/2014 0605   CREATININE 0.81 09/29/2012 1152   CALCIUM 7.9 (L) 11/14/2014 0605   PROT 6.6 09/29/2012 1152   ALBUMIN 4.3 09/29/2012 1152   AST 28 09/29/2012 1152   ALT 19 09/29/2012  1152   ALKPHOS 54 09/29/2012 1152   BILITOT 0.9 09/29/2012 1152   GFRNONAA >60 11/14/2014 0605   GFRAA >60 11/14/2014 0605       Component Value Date/Time   WBC 19.8 (H) 08/24/2016 0555   RBC 3.77 (L) 08/24/2016 0555   HGB 10.8 (L) 08/24/2016 0555   HCT 31.9 (L) 08/24/2016 0555   PLT 191 08/24/2016 0555   MCV 84.6 08/24/2016 0555   MCH 28.6 08/24/2016 0555   MCHC 33.9 08/24/2016 0555   RDW 14.6 08/24/2016 0555   LYMPHSABS 1.4 09/29/2012 1152   MONOABS 0.4 09/29/2012 1152   EOSABS 0.2 09/29/2012 1152   BASOSABS 0.0 09/29/2012 1152    No results found for: "POCLITH", "LITHIUM"   No results found for: "PHENYTOIN", "PHENOBARB", "VALPROATE", "CBMZ"   .res Assessment: Plan:    Plan:  PDMP reviewed  Zoloft 75mg  daily Adderall XR 30mg every morning  Adderall 5mg  in the afternoon.  ADD screening tool completed. Meets DSM-5 criteria for diagnosis of ADD. Psych Central scoring indicates 46/58 - ADD likely.  Monitor BP between visits while taking stimulant medication.   RTC 3 months  Patient advised to contact office with any questions, adverse effects, or acute worsening in signs and symptoms.   Discussed potential benefits, risks, and side effects of stimulants with patient to include increased heart rate, palpitations, insomnia, increased anxiety, increased irritability, or decreased appetite.  Instructed patient to  contact office if experiencing any significant tolerability issues.   There are no diagnoses linked to this encounter.   Please see After Visit Summary for patient specific instructions.  Future Appointments  Date Time Provider Department Center  09/11/2022  9:40 AM Merriam Brandner, Thereasa Solo, NP [facility] None    No orders of the defined types were placed in this encounter.     -------------------------------

## 2022-11-11 ENCOUNTER — Other Ambulatory Visit: Payer: Self-pay

## 2022-11-11 ENCOUNTER — Telehealth: Payer: Self-pay | Admitting: Adult Health

## 2022-11-11 DIAGNOSIS — F900 Attention-deficit hyperactivity disorder, predominantly inattentive type: Secondary | ICD-10-CM

## 2022-11-11 MED ORDER — AMPHETAMINE-DEXTROAMPHET ER 30 MG PO CP24: 30.0000 mg | ORAL_CAPSULE | Freq: Every day | ORAL | 0 refills | Status: DC

## 2022-11-11 MED ORDER — AMPHETAMINE-DEXTROAMPHETAMINE 5 MG PO TABS: 5.0000 mg | ORAL_TABLET | Freq: Every day | ORAL | 0 refills | Status: DC

## 2022-11-11 NOTE — Telephone Encounter (Signed)
Pended.

## 2022-11-11 NOTE — Telephone Encounter (Signed)
[facility] Repending both for [facility].

## 2022-11-11 NOTE — Telephone Encounter (Signed)
PT called in around 4:30pm. [facility]. She would like [place] to switch the RX to a different pharmacy. Resend it to [facility] on [address] [place], [place]. Please let her know it's been sent.

## 2022-11-11 NOTE — Telephone Encounter (Signed)
Patient on 2 different doses. Called her to see what dose, or if both, needed to be sent to [facility]. She said to send both to [facility].

## 2022-12-06 ENCOUNTER — Encounter: Payer: Self-pay | Admitting: Adult Health

## 2022-12-06 ENCOUNTER — Telehealth: Payer: 59 | Admitting: Adult Health

## 2022-12-06 DIAGNOSIS — F900 Attention-deficit hyperactivity disorder, predominantly inattentive type: Secondary | ICD-10-CM

## 2022-12-06 DIAGNOSIS — F411 Generalized anxiety disorder: Secondary | ICD-10-CM | POA: Diagnosis not present

## 2022-12-06 MED ORDER — AMPHETAMINE-DEXTROAMPHETAMINE 5 MG PO TABS: 5.0000 mg | ORAL_TABLET | Freq: Every day | ORAL | 0 refills | Status: DC

## 2022-12-06 MED ORDER — AMPHETAMINE-DEXTROAMPHET ER 30 MG PO CP24: 30.0000 mg | ORAL_CAPSULE | Freq: Every day | ORAL | 0 refills | Status: DC

## 2022-12-06 NOTE — Progress Notes (Signed)
Rebecca Howell [id number] Feb 24, 1976 47 y.o.  Virtual Visit via Video Note  I connected with pt @ on 12/06/22 at 10:40 AM EDT by a video enabled telemedicine application and verified that I am speaking with the correct person using two identifiers.   I discussed the limitations of evaluation and management by telemedicine and the availability of in person appointments. The patient expressed understanding and agreed to proceed.  I discussed the assessment and treatment plan with the patient. The patient was provided an opportunity to ask questions and all were answered. The patient agreed with the plan and demonstrated an understanding of the instructions.   The patient was advised to call back or seek an in-person evaluation if the symptoms worsen or if the condition fails to improve as anticipated.  I provided 10 minutes of non-face-to-face time during this encounter.  The patient was located at home.  The provider was located at [facility].   Dorothyann Gibbs, NP   Subjective:   Patient ID:  Rebecca Howell is a 47 y.o. (DOB [birth year]) female.  Chief Complaint: No chief complaint on file.   HPI Rebecca Howell presents for follow-up of ADD and GAD.  Describes mood today as "ok". Pleasant. Denies tearfulness. Mood symptoms - denies depression, anxiety, and irritability. Denies panic attacks. Denies worry and rumination. Reports some over thinking.  Mood is consistent. Stating "I feel like I'm doing alright". Feels like current medication works well. Stable interest and motivation. Taking medications as prescribed.  Energy levels stable. Active, does not have a regular exercise routine.    Enjoys some usual interests and activities. Married x 18 years - 1 son age 79 - 2 dogs. Family local. Spending time with family. Appetite adequate. Weight stable - 135 pounds - 64". Sleeps well most nights. Averages 6 to 7 hours. Focus and concentration improved. Completing tasks. Managing  aspects of household. 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  Denies AH or VH. Denies self harm. Denies substance use.  Seeing therapist.  Previous medication trials:  Sertraline, Paxil, Xanax  Review of Systems:  Review of Systems  Musculoskeletal:  Negative for gait problem.  Neurological:  Negative for tremors.  Psychiatric/Behavioral:         Please refer to HPI    Medications: I have reviewed the patient's current medications.  Current Outpatient Medications  Medication Sig Dispense Refill   amphetamine-dextroamphetamine (ADDERALL XR) 30 MG 24 hr capsule Take 1 capsule (30 mg total) by mouth daily. 30 capsule 0   amphetamine-dextroamphetamine (ADDERALL XR) 30 MG 24 hr capsule Take 1 capsule (30 mg total) by mouth daily. 30 capsule 0   amphetamine-dextroamphetamine (ADDERALL XR) 30 MG 24 hr capsule Take 1 capsule (30 mg total) by mouth daily. 30 capsule 0   amphetamine-dextroamphetamine (ADDERALL) 5 MG tablet Take 1 tablet (5 mg total) by mouth daily. 30 tablet 0   amphetamine-dextroamphetamine (ADDERALL) 5 MG tablet Take 1 tablet (5 mg total) by mouth daily. 30 tablet 0   sertraline (ZOLOFT) 25 MG tablet Take 1 tablet (25 mg total) by mouth daily. 90 tablet 1   sertraline (ZOLOFT) 50 MG tablet Take 1 tablet (50 mg total) by mouth daily. 90 tablet 1   No current facility-administered medications for this visit.    Medication Side Effects: None  Allergies: No Known Allergies  Past Medical History:  Diagnosis Date   Anxiety    CIN I (cervical intraepithelial neoplasia I)    CIN II (cervical intraepithelial neoplasia II)  Depression    on meds   Fever blister    Fibroid    PONV (postoperative nausea and vomiting)    Vaginal Pap smear, abnormal     Family History  Problem Relation Age of Onset   Pancreatic cancer Maternal Grandmother    Hypertension Maternal Grandmother    Hypertension Maternal Grandfather    Breast cancer Paternal Grandmother        Age  62's   Hypertension Paternal Grandmother    Colon cancer Paternal Grandfather    Hypertension Paternal Grandfather    Healthy Mother    Healthy Father    Healthy Sister     Social History   Socioeconomic History   Marital status: Married    Spouse name: Not on file   Number of children: Not on file   Years of education: Not on file   Highest education level: Not on file  Occupational History   Not on file  Tobacco Use   Smoking status: Former   Smokeless tobacco: Never  Substance and Sexual Activity   Alcohol use: Yes    Alcohol/week: 14.0 standard drinks of alcohol    Types: 14 Standard drinks or equivalent per week    Comment: 10-15 drinks per week.  1-2 glasses wine nightly   Drug use: No   Sexual activity: Yes    Birth control/protection: Condom  Other Topics Concern   Not on file  Social History Narrative   Lives with husband in a one story house.  Has no children.     Works as a Customer service manager.     Education: college   Social Determinants of Corporate investment banker Strain: Not on file  Food Insecurity: Not on file  Transportation Needs: Not on file  Physical Activity: Not on file  Stress: Not on file  Social Connections: Not on file  Intimate Partner Violence: Not on file    Past Medical History, Surgical history, Social history, and Family history were reviewed and updated as appropriate.   Please see review of systems for further details on the patient's review from today.   Objective:   Physical Exam:  There were no vitals taken for this visit.  Physical Exam Constitutional:      General: She is not in acute distress. Musculoskeletal:        General: No deformity.  Neurological:     Mental Status: She is alert and oriented to person, place, and time.     Coordination: Coordination normal.  Psychiatric:        Attention and Perception: Attention and perception normal. She does not perceive auditory or visual hallucinations.        Mood  and Affect: Affect is not labile, blunt, angry or inappropriate.        Speech: Speech normal.        Behavior: Behavior normal.        Thought Content: Thought content normal. Thought content is not paranoid or delusional. Thought content does not include homicidal or suicidal ideation. Thought content does not include homicidal or suicidal plan.        Cognition and Memory: Cognition and memory normal.        Judgment: Judgment normal.     Comments: Insight intact     Lab Review:     Component Value Date/Time   NA 137 11/14/2014 0605   K 3.7 11/14/2014 0605   CL 106 11/14/2014 0605   CO2 24 11/14/2014 4098  GLUCOSE 110 (H) 11/14/2014 0605   BUN <5 (L) 11/14/2014 0605   CREATININE 0.60 11/14/2014 0605   CREATININE 0.81 09/29/2012 1152   CALCIUM 7.9 (L) 11/14/2014 0605   PROT 6.6 09/29/2012 1152   ALBUMIN 4.3 09/29/2012 1152   AST 28 09/29/2012 1152   ALT 19 09/29/2012 1152   ALKPHOS 54 09/29/2012 1152   BILITOT 0.9 09/29/2012 1152   GFRNONAA >60 11/14/2014 0605   GFRAA >60 11/14/2014 0605       Component Value Date/Time   WBC 19.8 (H) 08/24/2016 0555   RBC 3.77 (L) 08/24/2016 0555   HGB 10.8 (L) 08/24/2016 0555   HCT 31.9 (L) 08/24/2016 0555   PLT 191 08/24/2016 0555   MCV 84.6 08/24/2016 0555   MCH 28.6 08/24/2016 0555   MCHC 33.9 08/24/2016 0555   RDW 14.6 08/24/2016 0555   LYMPHSABS 1.4 09/29/2012 1152   MONOABS 0.4 09/29/2012 1152   EOSABS 0.2 09/29/2012 1152   BASOSABS 0.0 09/29/2012 1152    No results found for: "POCLITH", "LITHIUM"   No results found for: "PHENYTOIN", "PHENOBARB", "VALPROATE", "CBMZ"   .res Assessment: Plan:    Plan:  PDMP reviewed  Zoloft 75mg  daily Adderall XR 30mg every morning  119/80  Adderall 5mg  in the afternoon - not taking every day  ADD screening tool completed. Meets DSM-5 criteria for diagnosis of ADD. Psych Central scoring indicates 46/58 - ADD likely.  Monitor BP between visits while taking stimulant  medication.   RTC 3 months  Patient advised to contact office with any questions, adverse effects, or acute worsening in signs and symptoms.   Discussed potential benefits, risks, and side effects of stimulants with patient to include increased heart rate, palpitations, insomnia, increased anxiety, increased irritability, or decreased appetite.  Instructed patient to contact office if experiencing any significant tolerability issues.  Diagnoses and all orders for this visit:  Attention deficit hyperactivity disorder (ADHD), predominantly inattentive type -     amphetamine-dextroamphetamine (ADDERALL XR) 30 MG 24 hr capsule; Take 1 capsule (30 mg total) by mouth daily. -     amphetamine-dextroamphetamine (ADDERALL) 5 MG tablet; Take 1 tablet (5 mg total) by mouth daily.  Generalized anxiety disorder     Please see After Visit Summary for patient specific instructions.  No future appointments.  No orders of the defined types were placed in this encounter.     -------------------------------

## 2022-12-21 ENCOUNTER — Other Ambulatory Visit: Payer: Self-pay | Admitting: Adult Health

## 2022-12-21 DIAGNOSIS — F411 Generalized anxiety disorder: Secondary | ICD-10-CM

## 2023-02-17 ENCOUNTER — Other Ambulatory Visit: Payer: Self-pay

## 2023-02-17 ENCOUNTER — Telehealth: Payer: Self-pay | Admitting: Adult Health

## 2023-02-17 DIAGNOSIS — F900 Attention-deficit hyperactivity disorder, predominantly inattentive type: Secondary | ICD-10-CM

## 2023-02-17 MED ORDER — AMPHETAMINE-DEXTROAMPHET ER 30 MG PO CP24: 30.0000 mg | ORAL_CAPSULE | Freq: Every day | ORAL | 0 refills | Status: DC

## 2023-02-17 MED ORDER — AMPHETAMINE-DEXTROAMPHETAMINE 5 MG PO TABS: 5.0000 mg | ORAL_TABLET | Freq: Every day | ORAL | 0 refills | Status: DC

## 2023-02-17 NOTE — Telephone Encounter (Signed)
PT lvm that she needs a refill on her adderall xr 30 mg and adderall 5 mg. Pharmacy is [facility] located at [address]

## 2023-02-17 NOTE — Telephone Encounter (Signed)
Pended both requested [facility]der[facility] doses to [facility] in [place].

## 2023-02-17 NOTE — Telephone Encounter (Signed)
Pt called back at 3:34p asking for the scripts to be sent to [facility] [place].

## 2023-03-28 ENCOUNTER — Other Ambulatory Visit: Payer: Self-pay

## 2023-03-28 ENCOUNTER — Telehealth: Payer: Self-pay | Admitting: Adult Health

## 2023-03-28 DIAGNOSIS — F900 Attention-deficit hyperactivity disorder, predominantly inattentive type: Secondary | ICD-10-CM

## 2023-03-28 MED ORDER — AMPHETAMINE-DEXTROAMPHET ER 30 MG PO CP24: 30.0000 mg | ORAL_CAPSULE | Freq: Every day | ORAL | 0 refills | Status: DC

## 2023-03-28 NOTE — Telephone Encounter (Signed)
Pt lvm that she needs a refill on her adderall xr 30 mg. [facility]acy [facility] [place]

## 2023-03-28 NOTE — Telephone Encounter (Signed)
Please schedule pt an appt lv 10/11 due back in 3 months.   Pended adderall 30 xr to [facility].

## 2023-03-28 NOTE — Telephone Encounter (Signed)
I sent the ladies up front a msg to call and get her scheduled.

## 2023-03-31 NOTE — Telephone Encounter (Signed)
 Lvm for pt to call and schedule

## 2023-04-07 ENCOUNTER — Other Ambulatory Visit: Payer: Self-pay | Admitting: Adult Health

## 2023-04-07 DIAGNOSIS — F411 Generalized anxiety disorder: Secondary | ICD-10-CM

## 2023-04-08 NOTE — Telephone Encounter (Signed)
Please schedule pt an appt lv 10/11 due back in 3 months.

## 2023-04-08 NOTE — Telephone Encounter (Signed)
Lvm for pt to call and schedule

## 2023-04-30 ENCOUNTER — Telehealth: Payer: 59 | Admitting: Adult Health

## 2023-04-30 ENCOUNTER — Encounter: Payer: Self-pay | Admitting: Adult Health

## 2023-04-30 DIAGNOSIS — F411 Generalized anxiety disorder: Secondary | ICD-10-CM | POA: Diagnosis not present

## 2023-04-30 DIAGNOSIS — F900 Attention-deficit hyperactivity disorder, predominantly inattentive type: Secondary | ICD-10-CM | POA: Diagnosis not present

## 2023-04-30 MED ORDER — AMPHETAMINE-DEXTROAMPHETAMINE 5 MG PO TABS: 5.0000 mg | ORAL_TABLET | Freq: Every day | ORAL | 0 refills | Status: DC

## 2023-04-30 MED ORDER — AMPHETAMINE-DEXTROAMPHET ER 30 MG PO CP24: 30.0000 mg | ORAL_CAPSULE | Freq: Every day | ORAL | 0 refills | Status: DC

## 2023-04-30 MED ORDER — SERTRALINE HCL 50 MG PO TABS: 50.0000 mg | ORAL_TABLET | Freq: Every day | ORAL | 1 refills | Status: DC

## 2023-04-30 MED ORDER — SERTRALINE HCL 25 MG PO TABS: 25.0000 mg | ORAL_TABLET | Freq: Every day | ORAL | 1 refills | Status: DC

## 2023-04-30 NOTE — Progress Notes (Signed)
 Rebecca Howell [id number] [birth year] 48 y.o.  Virtual Visit via Video Note  I connected with pt @ on 04/30/23 at  8:30 AM EST by a video enabled telemedicine application and verified that I am speaking with the correct person using two identifiers.   I discussed the limitations of evaluation and management by telemedicine and the availability of in person appointments. The patient expressed understanding and agreed to proceed.  I discussed the assessment and treatment plan with the patient. The patient was provided an opportunity to ask questions and all were answered. The patient agreed with the plan and demonstrated an understanding of the instructions.   The patient was advised to call back or seek an in-person evaluation if the symptoms worsen or if the condition fails to improve as anticipated.  I provided 10 minutes of non-face-to-face time during this encounter. The patient was located at home.  The provider was located at [facility].   Dorothyann Gibbs, NP   Subjective:   Patient ID:  Rebecca Howell is a 48 y.o. (DOB 12-12-1975) female.  Chief Complaint: No chief complaint on file.   HPI Rebecca Howell presents for follow-up of ADD and GAD.  Describes mood today as "ok". Pleasant. Denies tearfulness. Mood symptoms - denies depression, anxiety, and irritability. Denies panic attacks. Denies worry and rumination. Reports decreased over thinking.  Mood is consistent. Stating "I feel like I'm doing ok". Feels like current medication works well. Stable interest and motivation. Taking medications as prescribed.  Energy levels stable. Active, does not have a regular exercise routine.    Enjoys some usual interests and activities. Married - 1 son age 19 - 2 dogs. Family local. Spending time with family. Appetite adequate. Weight stable - 135 pounds - 64". Sleeps well most nights. Averages 6 to 7 hours. Focus and concentration improved. Completing tasks. Managing aspects of  household. 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  Denies AH or VH. Denies self harm. Denies substance use. Seeing therapist.  Previous medication trials:  Sertraline, Paxil, Xanax  Review of Systems:  Review of Systems  Musculoskeletal:  Negative for gait problem.  Neurological:  Negative for tremors.  Psychiatric/Behavioral:         Please refer to HPI   Medications: I have reviewed the patient's current medications.  Current Outpatient Medications  Medication Sig Dispense Refill   amphetamine-dextroamphetamine (ADDERALL XR) 30 MG 24 hr capsule Take 1 capsule (30 mg total) by mouth daily. 30 capsule 0   [START ON 05/28/2023] amphetamine-dextroamphetamine (ADDERALL XR) 30 MG 24 hr capsule Take 1 capsule (30 mg total) by mouth daily. 30 capsule 0   [START ON 06/25/2023] amphetamine-dextroamphetamine (ADDERALL XR) 30 MG 24 hr capsule Take 1 capsule (30 mg total) by mouth daily. 30 capsule 0   amphetamine-dextroamphetamine (ADDERALL) 5 MG tablet Take 1 tablet (5 mg total) by mouth daily. 30 tablet 0   [START ON 05/28/2023] amphetamine-dextroamphetamine (ADDERALL) 5 MG tablet Take 1 tablet (5 mg total) by mouth daily. 30 tablet 0   sertraline (ZOLOFT) 25 MG tablet Take 1 tablet (25 mg total) by mouth daily. 90 tablet 1   sertraline (ZOLOFT) 50 MG tablet Take 1 tablet (50 mg total) by mouth daily. 90 tablet 1   No current facility-administered medications for this visit.    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)    Depression    on meds   Fever blister    Fibroid    PONV (postoperative nausea and vomiting)    Vaginal Pap smear, abnormal     Family History  Problem Relation Age of Onset   Pancreatic cancer Maternal Grandmother    Hypertension Maternal Grandmother    Hypertension Maternal Grandfather     Breast cancer Paternal Grandmother        Age 50's   Hypertension Paternal Grandmother    Colon cancer Paternal Grandfather    Hypertension Paternal Grandfather    Healthy Mother    Healthy Father    Healthy Sister     Social History   Socioeconomic History   Marital status: Married    Spouse name: Not on file   Number of children: Not on file   Years of education: Not on file   Highest education level: Not on file  Occupational History   Not on file  Tobacco Use   Smoking status: Former   Smokeless tobacco: Never  Substance and Sexual Activity   Alcohol use: Yes    Alcohol/week: 14.0 standard drinks of alcohol    Types: 14 Standard drinks or equivalent per week    Comment: 10-15 drinks per week.  1-2 glasses wine nightly   Drug use: No   Sexual activity: Yes    Birth control/protection: Condom  Other Topics Concern   Not on file  Social History Narrative   Lives with husband in a one story house.  Has no children.     Works as a Customer service manager.     Education: college   Social Drivers of Corporate investment banker Strain: Not on file  Food Insecurity: Not on file  Transportation Needs: Not on file  Physical Activity: Not on file  Stress: Not on file  Social Connections: Not on file  Intimate Partner Violence: Not on file    Past Medical History, Surgical history, Social history, and Family history were reviewed and updated as appropriate.   Please see review of systems for further details on the patient's review from today.   Objective:   Physical Exam:  There were no vitals taken for this visit.  Physical Exam Constitutional:      General: She is not in acute distress. Musculoskeletal:        General: No deformity.  Neurological:     Mental Status: She is alert and oriented to person, place, and time.     Coordination: Coordination normal.  Psychiatric:        Attention and Perception: Attention and perception normal. She does not perceive  auditory or visual hallucinations.        Mood and Affect: Affect is not labile, blunt, angry or inappropriate.        Speech: Speech normal.        Behavior: Behavior normal.        Thought Content: Thought content normal. Thought content is not paranoid or delusional. Thought content does not include homicidal or suicidal ideation. Thought content does not include homicidal or suicidal plan.        Cognition and Memory: Cognition and memory normal.        Judgment: Judgment normal.     Comments: Insight intact     Lab Review:     Component Value Date/Time   NA 137 11/14/2014 0605   K 3.7 11/14/2014 0605   CL 106 11/14/2014  0605   CO2 24 11/14/2014 0605   GLUCOSE 110 (H) 11/14/2014 0605   BUN <5 (L) 11/14/2014 0605   CREATININE 0.60 11/14/2014 0605   CREATININE 0.81 09/29/2012 1152   CALCIUM 7.9 (L) 11/14/2014 0605   PROT 6.6 09/29/2012 1152   ALBUMIN 4.3 09/29/2012 1152   AST 28 09/29/2012 1152   ALT 19 09/29/2012 1152   ALKPHOS 54 09/29/2012 1152   BILITOT 0.9 09/29/2012 1152   GFRNONAA >60 11/14/2014 0605   GFRAA >60 11/14/2014 0605       Component Value Date/Time   WBC 19.8 (H) 08/24/2016 0555   RBC 3.77 (L) 08/24/2016 0555   HGB 10.8 (L) 08/24/2016 0555   HCT 31.9 (L) 08/24/2016 0555   PLT 191 08/24/2016 0555   MCV 84.6 08/24/2016 0555   MCH 28.6 08/24/2016 0555   MCHC 33.9 08/24/2016 0555   RDW 14.6 08/24/2016 0555   LYMPHSABS 1.4 09/29/2012 1152   MONOABS 0.4 09/29/2012 1152   EOSABS 0.2 09/29/2012 1152   BASOSABS 0.0 09/29/2012 1152    No results found for: "POCLITH", "LITHIUM"   No results found for: "PHENYTOIN", "PHENOBARB", "VALPROATE", "CBMZ"   .res Assessment: Plan:    Plan:  PDMP reviewed  Zoloft 75mg  daily Adderall XR 30mg  every morning  Adderall 5mg  in the afternoon - not taking every day  Monitor BP between visits while taking stimulant medication.   ADD screening tool completed. Meets DSM-5 criteria for diagnosis of ADD. Psych  Central scoring indicates 46/58 - ADD likely.  Monitor BP between visits while taking stimulant medication.   RTC 3 months  10 minutes spent dedicated to the care of this patient on the date of this encounter to include pre-visit review of records, ordering of medication, post visit documentation, and face-to-face time with the patient discussing ADHD and anxiety. Discussed continuing current medication regimen.  Patient advised to contact office with any questions, adverse effects, or acute worsening in signs and symptoms.   Discussed potential benefits, risks, and side effects of stimulants with patient to include increased heart rate, palpitations, insomnia, increased anxiety, increased irritability, or decreased appetite.  Instructed patient to contact office if experiencing any significant tolerability issues.   Diagnoses and all orders for this visit:  Attention deficit hyperactivity disorder (ADHD), predominantly inattentive type -     amphetamine-dextroamphetamine (ADDERALL XR) 30 MG 24 hr capsule; Take 1 capsule (30 mg total) by mouth daily. -     amphetamine-dextroamphetamine (ADDERALL XR) 30 MG 24 hr capsule; Take 1 capsule (30 mg total) by mouth daily. -     amphetamine-dextroamphetamine (ADDERALL XR) 30 MG 24 hr capsule; Take 1 capsule (30 mg total) by mouth daily. -     amphetamine-dextroamphetamine (ADDERALL) 5 MG tablet; Take 1 tablet (5 mg total) by mouth daily. -     amphetamine-dextroamphetamine (ADDERALL) 5 MG tablet; Take 1 tablet (5 mg total) by mouth daily.  Generalized anxiety disorder -     sertraline (ZOLOFT) 25 MG tablet; Take 1 tablet (25 mg total) by mouth daily. -     sertraline (ZOLOFT) 50 MG tablet; Take 1 tablet (50 mg total) by mouth daily.     Please see After Visit Summary for patient specific instructions.  No future appointments.   No orders of the defined types were placed in this encounter.     -------------------------------

## 2023-07-31 ENCOUNTER — Telehealth: Admitting: Adult Health

## 2023-07-31 ENCOUNTER — Encounter: Payer: Self-pay | Admitting: Adult Health

## 2023-07-31 DIAGNOSIS — F900 Attention-deficit hyperactivity disorder, predominantly inattentive type: Secondary | ICD-10-CM | POA: Diagnosis not present

## 2023-07-31 DIAGNOSIS — F411 Generalized anxiety disorder: Secondary | ICD-10-CM

## 2023-07-31 MED ORDER — AMPHETAMINE-DEXTROAMPHET ER 30 MG PO CP24: 30.0000 mg | ORAL_CAPSULE | Freq: Every day | ORAL | 0 refills | Status: DC

## 2023-07-31 MED ORDER — SERTRALINE HCL 50 MG PO TABS: 50.0000 mg | ORAL_TABLET | Freq: Every day | ORAL | 1 refills | Status: DC

## 2023-07-31 MED ORDER — SERTRALINE HCL 25 MG PO TABS: 25.0000 mg | ORAL_TABLET | Freq: Every day | ORAL | 1 refills | Status: DC

## 2023-07-31 NOTE — Progress Notes (Signed)
 Rebecca Howell [id number] [birth year] 48 y.o.  Virtual Visit via Video Note  I connected with pt @ on 07/31/23 at 12:00 PM EDT by a video enabled telemedicine application and verified that I am speaking with the correct person using two identifiers.   I discussed the limitations of evaluation and management by telemedicine and the availability of in person appointments. The patient expressed understanding and agreed to proceed.  I discussed the assessment and treatment plan with the patient. The patient was provided an opportunity to ask questions and all were answered. The patient agreed with the plan and demonstrated an understanding of the instructions.   The patient was advised to call back or seek an in-person evaluation if the symptoms worsen or if the condition fails to improve as anticipated.  I provided 10 minutes of non-face-to-face time during this encounter.  The patient was located at home.  The provider was located at [facility].   Reagan Camera, NP   Subjective:   Patient ID:  Rebecca Howell is a 48 y.o. (DOB 16-Jul-1975) female.  Chief Complaint: No chief complaint on file.   HPI Rebecca Howell presents for follow-up of ADD and GAD.  Describes mood today as "ok". Pleasant. Denies tearfulness. Mood symptoms - denies depression and irritability. Reports some situational anxiety. Denies panic attacks. Denies worry, rumination and over thinking. Mood is consistent. Stating "I feel like I'm doing ok". Feels like current medication works well. Stable interest and motivation. Taking medications as prescribed.  Energy levels stable. Active, does not have a regular exercise routine.    Enjoys some usual interests and activities. Married - 1 son - 2 dogs. Family local. Spending time with family. Appetite adequate. Weight stable - 135 pounds - 64". Sleeps well most nights. Averages 6 to 7 hours. Focus and concentration improved. Completing tasks. Managing aspects of  household. 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 Denies SI or HI.  Denies AH or VH. Denies self harm. Denies substance use. Seeing therapist.  Previous medication trials:  Sertraline , Paxil, Xanax    Review of Systems:  Review of Systems  Musculoskeletal:  Negative for gait problem.  Neurological:  Negative for tremors.  Psychiatric/Behavioral:         Please refer to HPI    Medications: I have reviewed the patient's current medications.  Current Outpatient Medications  Medication Sig Dispense Refill   amphetamine -dextroamphetamine  (ADDERALL XR) 30 MG 24 hr capsule Take 1 capsule (30 mg total) by mouth daily. 30 capsule 0   amphetamine -dextroamphetamine  (ADDERALL XR) 30 MG 24 hr capsule Take 1 capsule (30 mg total) by mouth daily. 30 capsule 0   amphetamine -dextroamphetamine  (ADDERALL XR) 30 MG 24 hr capsule Take 1 capsule (30 mg total) by mouth daily. 30 capsule 0   amphetamine -dextroamphetamine  (ADDERALL) 5 MG tablet Take 1 tablet (5 mg total) by mouth daily. 30 tablet 0   amphetamine -dextroamphetamine  (ADDERALL) 5 MG tablet Take 1 tablet (5 mg total) by mouth daily. 30 tablet 0   sertraline  (ZOLOFT ) 25 MG tablet Take 1 tablet (25 mg total) by mouth daily. 90 tablet 1   sertraline  (ZOLOFT ) 50 MG tablet Take 1 tablet (50 mg total) by mouth daily. 90 tablet 1   No current facility-administered medications for this visit.    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)    Depression    on meds   Fever blister    Fibroid    PONV (postoperative nausea and vomiting)    Vaginal Pap smear, abnormal     Family History  Problem Relation Age of Onset   Pancreatic cancer Maternal Grandmother    Hypertension Maternal Grandmother    Hypertension Maternal Grandfather    Breast cancer  Paternal Grandmother        Age 42's   Hypertension Paternal Grandmother    Colon cancer Paternal Grandfather    Hypertension Paternal Grandfather    Healthy Mother    Healthy Father    Healthy Sister     Social History   Socioeconomic History   Marital status: Married    Spouse name: Not on file   Number of children: Not on file   Years of education: Not on file   Highest education level: Not on file  Occupational History   Not on file  Tobacco Use   Smoking status: Former   Smokeless tobacco: Never  Substance and Sexual Activity   Alcohol use: Yes    Alcohol/week: 14.0 standard drinks of alcohol    Types: 14 Standard drinks or equivalent per week    Comment: 10-15 drinks per week.  1-2 glasses wine nightly   Drug use: No   Sexual activity: Yes    Birth control/protection: Condom  Other Topics Concern   Not on file  Social History Narrative   Lives with husband in a one story house.  Has no children.     Works as a Customer service manager.     Education: college   Social Drivers of Corporate investment banker Strain: Not on file  Food Insecurity: Low Risk  (06/04/2023)   Received from [facility]   Hunger Vital Sign    Worried About Running Out of Food in the Last Year: Never true    Ran Out of Food in the Last Year: Never true  Transportation Needs: No Transportation Needs (06/04/2023)   Received from [facility]    In the past 12 months, has lack of reliable transportation kept you from medical appointments, meetings, work or from getting things needed for daily living? : No  Physical Activity: Not on file  Stress: Not on file  Social Connections: Not on file  Intimate Partner Violence: Not on file    Past Medical History, Surgical history, Social history, and Family history were reviewed and updated as appropriate.   Please see review of systems for further details on the patient's review from today.   Objective:   Physical Exam:  There  were no vitals taken for this visit.  Physical Exam Constitutional:      General: She is not in acute distress. Musculoskeletal:        General: No deformity.  Neurological:     Mental Status: She is alert and oriented to person, place, and time.     Coordination: Coordination normal.  Psychiatric:        Attention and Perception: Attention and perception normal. She does not perceive auditory or visual hallucinations.        Mood and Affect: Mood normal. Mood is not anxious or depressed. Affect is not labile, blunt, angry or inappropriate.        Speech: Speech normal.        Behavior: Behavior normal.        Thought Content: Thought  content normal. Thought content is not paranoid or delusional. Thought content does not include homicidal or suicidal ideation. Thought content does not include homicidal or suicidal plan.        Cognition and Memory: Cognition and memory normal.        Judgment: Judgment normal.     Comments: Insight intact     Lab Review:     Component Value Date/Time   NA 137 11/14/2014 0605   K 3.7 11/14/2014 0605   CL 106 11/14/2014 0605   CO2 24 11/14/2014 0605   GLUCOSE 110 (H) 11/14/2014 0605   BUN <5 (L) 11/14/2014 0605   CREATININE 0.60 11/14/2014 0605   CREATININE 0.81 09/29/2012 1152   CALCIUM 7.9 (L) 11/14/2014 0605   PROT 6.6 09/29/2012 1152   ALBUMIN 4.3 09/29/2012 1152   AST 28 09/29/2012 1152   ALT 19 09/29/2012 1152   ALKPHOS 54 09/29/2012 1152   BILITOT 0.9 09/29/2012 1152   GFRNONAA >60 11/14/2014 0605   GFRAA >60 11/14/2014 0605       Component Value Date/Time   WBC 19.8 (H) 08/24/2016 0555   RBC 3.77 (L) 08/24/2016 0555   HGB 10.8 (L) 08/24/2016 0555   HCT 31.9 (L) 08/24/2016 0555   PLT 191 08/24/2016 0555   MCV 84.6 08/24/2016 0555   MCH 28.6 08/24/2016 0555   MCHC 33.9 08/24/2016 0555   RDW 14.6 08/24/2016 0555   LYMPHSABS 1.4 09/29/2012 1152   MONOABS 0.4 09/29/2012 1152   EOSABS 0.2 09/29/2012 1152   BASOSABS 0.0  09/29/2012 1152    No results found for: "POCLITH", "LITHIUM"   No results found for: "PHENYTOIN", "PHENOBARB", "VALPROATE", "CBMZ"   .res Assessment: Plan:    Plan:  PDMP reviewed  Zoloft  75mg  daily (50 and 25mg ) Adderall XR 30mg  every morning Adderall 5mg  in the afternoon - not taking every day  Monitor BP between visits while taking stimulant medication.   ADD screening tool completed. Meets DSM-5 criteria for diagnosis of ADD. Psych Central scoring indicates 46/58 - ADD likely.  Monitor BP between visits while taking stimulant medication.   RTC 3 months  10 minutes spent dedicated to the care of this patient on the date of this encounter to include pre-visit review of records, ordering of medication, post visit documentation, and face-to-face time with the patient discussing ADHD and anxiety. Discussed continuing current medication regimen.  Patient advised to contact office with any questions, adverse effects, or acute worsening in signs and symptoms.   Discussed potential benefits, risks, and side effects of stimulants with patient to include increased heart rate, palpitations, insomnia, increased anxiety, increased irritability, or decreased appetite.  Instructed patient to contact office if experiencing any significant tolerability issues.   Diagnoses and all orders for this visit:  Attention deficit hyperactivity disorder (ADHD), predominantly inattentive type  Generalized anxiety disorder     Please see After Visit Summary for patient specific instructions.  Future Appointments  Date Time Provider Department Center  07/31/2023 12:00 PM Christifer Chapdelaine Nattalie, NP [facility] None    No orders of the defined types were placed in this encounter.     -------------------------------

## 2024-02-05 ENCOUNTER — Encounter: Payer: Self-pay | Admitting: Adult Health

## 2024-02-05 ENCOUNTER — Telehealth: Admitting: Adult Health

## 2024-02-05 DIAGNOSIS — F900 Attention-deficit hyperactivity disorder, predominantly inattentive type: Secondary | ICD-10-CM

## 2024-02-05 DIAGNOSIS — F909 Attention-deficit hyperactivity disorder, unspecified type: Secondary | ICD-10-CM | POA: Diagnosis not present

## 2024-02-05 DIAGNOSIS — F411 Generalized anxiety disorder: Secondary | ICD-10-CM

## 2024-02-05 MED ORDER — AMPHETAMINE-DEXTROAMPHET ER 30 MG PO CP24: 30.0000 mg | ORAL_CAPSULE | Freq: Every day | ORAL | 0 refills | Status: AC

## 2024-02-05 MED ORDER — AMPHETAMINE-DEXTROAMPHETAMINE 5 MG PO TABS: 5.0000 mg | ORAL_TABLET | Freq: Every day | ORAL | 0 refills | Status: AC

## 2024-02-05 NOTE — Progress Notes (Signed)
 [name] [id number] [birth year] 48 y.o.  Virtual Visit via Video Note  I connected with pt @ on 02/05/2024 at  1:00 PM EST by a video enabled telemedicine application and verified that I am speaking with the correct person using two identifiers.   I discussed the limitations of evaluation and management by telemedicine and the availability of in person appointments. The patient expressed understanding and agreed to proceed.  I discussed the assessment and treatment plan with the patient. The patient was provided an opportunity to ask questions and all were answered. The patient agreed with the plan and demonstrated an understanding of the instructions.   The patient was advised to call back or seek an in-person evaluation if the symptoms worsen or if the condition fails to improve as anticipated.  I provided 10 minutes of non-face-to-face time during this encounter.  The patient was located at home.  The provider was located at [facility].   Angeline LOISE Sayers, NP   Subjective:   Patient ID:  Rebecca Howell is a 48 y.o. (DOB [birth year]) female.  Chief Complaint: No chief complaint on file.   HPI Kiah Dresser presents for follow-up of ADD and GAD.  Describes mood today as ok. Pleasant. Denies tearfulness. Mood symptoms - denies depression and irritability. Reports stable interest and motivation. Reports situational anxiety - it comes and goes. Denies panic attacks. Denies worry, rumination and over thinking. Reports mood is consistent. Stating I feel like I'm doing good. Feels like current medication works well. Taking medications as prescribed.  Energy levels stable. Active, does not have a regular exercise routine.    Enjoys some usual interests and activities. Married - 1 son - 2 dogs. Family local. Spending time with family. Appetite adequate. Weight stable - 135 pounds - 66. Sleeps well most nights. Averages 7 hours. Focus and concentration improved. Completing  tasks. Managing aspects of household. 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 Denies SI or HI.  Denies AH or VH. Denies self harm. Denies substance use. Seeing therapist.  Previous medication trials:  Sertraline , Paxil, Xanax    Review of Systems:  Review of Systems  Musculoskeletal:  Negative for gait problem.  Neurological:  Negative for tremors.  Psychiatric/Behavioral:         Please refer to HPI    Medications: I have reviewed the patient's current medications.  Current Outpatient Medications  Medication Sig Dispense Refill   amphetamine -dextroamphetamine  (ADDERALL XR) 30 MG 24 hr capsule Take 1 capsule (30 mg total) by mouth daily. 30 capsule 0   amphetamine -dextroamphetamine  (ADDERALL XR) 30 MG 24 hr capsule Take 1 capsule (30 mg total) by mouth daily. 30 capsule 0   amphetamine -dextroamphetamine  (ADDERALL XR) 30 MG 24 hr capsule Take 1 capsule (30 mg total) by mouth daily. 30 capsule 0   amphetamine -dextroamphetamine  (ADDERALL) 5 MG tablet Take 1 tablet (5 mg total) by mouth daily. 30 tablet 0   amphetamine -dextroamphetamine  (ADDERALL) 5 MG tablet Take 1 tablet (5 mg total) by mouth daily. 30 tablet 0   sertraline  (ZOLOFT ) 25 MG tablet Take 1 tablet (25 mg total) by mouth daily. 90 tablet 1   sertraline  (ZOLOFT ) 50 MG tablet Take 1 tablet (50 mg total) by mouth daily. 90 tablet 1   No current facility-administered medications for this visit.    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)    Depression    on meds   Fever blister    Fibroid    PONV (postoperative nausea and vomiting)    Vaginal Pap smear, abnormal     Family History  Problem Relation Age of Onset   Pancreatic cancer Maternal Grandmother    Hypertension Maternal Grandmother    Hypertension Maternal Grandfather     Breast cancer Paternal Grandmother        Age 47's   Hypertension Paternal Grandmother    Colon cancer Paternal Grandfather    Hypertension Paternal Grandfather    Healthy Mother    Healthy Father    Healthy Sister     Social History   Socioeconomic History   Marital status: Married    Spouse name: Not on file   Number of children: Not on file   Years of education: Not on file   Highest education level: Not on file  Occupational History   Not on file  Tobacco Use   Smoking status: Former   Smokeless tobacco: Never  Substance and Sexual Activity   Alcohol use: Yes    Alcohol/week: 14.0 standard drinks of alcohol    Types: 14 Standard drinks or equivalent per week    Comment: 10-15 drinks per week.  1-2 glasses wine nightly   Drug use: No   Sexual activity: Yes    Birth control/protection: Condom  Other Topics Concern   Not on file  Social History Narrative   Lives with husband in a one story house.  Has no children.     Works as a customer service manager.     Education: college   Social Drivers of Health   Tobacco Use: Medium Risk (07/31/2023)   Patient History    Smoking Tobacco Use: Former    Smokeless Tobacco Use: Never    Passive Exposure: Not on Actuary Strain: Not on file  Food Insecurity: Low Risk (06/04/2023)   Received from [facility]   Epic    Within the past 12 months, you worried that your food would run out before you got money to buy more: Never true    Within the past 12 months, the food you bought just didn't last and you didn't have money to get more. : Never true  Transportation Needs: No Transportation Needs (06/04/2023)   Received from [facility]    In the past 12 months, has lack of reliable transportation kept you from medical appointments, meetings, work or from getting things needed for daily living? : No  Physical Activity: Not on file  Stress: Not on file  Social Connections: Not on file  Intimate  Partner Violence: Not on file  Depression (EYV7-0): Not on file  Alcohol Screen: Not on file  Housing: Low Risk (06/04/2023)   Received from [facility]   Epic    What is your living situation today?: I have a steady place to live    Think about the place you live. Do you have problems with any of the following? Choose all that apply:: None/None on this list  Utilities: Low Risk (06/04/2023)   Received from [facility]   Utilities    In the past 12 months has the electric, gas, oil, or water company threatened to shut off services in your home? : No  Health Literacy: Not on file    Past Medical History, Surgical history, Social history, and Family history were reviewed and updated  as appropriate.   Please see review of systems for further details on the patient's review from today.   Objective:   Physical Exam:  There were no vitals taken for this visit.  Physical Exam Neurological:     Mental Status: She is alert and oriented to person, place, and time.     Coordination: Coordination normal.  Psychiatric:        Attention and Perception: Attention and perception normal. She does not perceive auditory or visual hallucinations.        Mood and Affect: Mood normal. Mood is not anxious or depressed. Affect is not labile, blunt, angry or inappropriate.        Speech: Speech normal.        Behavior: Behavior normal.        Thought Content: Thought content normal. Thought content is not paranoid or delusional. Thought content does not include homicidal or suicidal ideation. Thought content does not include homicidal or suicidal plan.        Cognition and Memory: Cognition and memory normal.        Judgment: Judgment normal.     Comments: Insight intact     Lab Review:     Component Value Date/Time   NA 137 11/14/2014 0605   K 3.7 11/14/2014 0605   CL 106 11/14/2014 0605   CO2 24 11/14/2014 0605   GLUCOSE 110 (H) 11/14/2014 0605   BUN <5 (L) 11/14/2014 0605   CREATININE 0.60  11/14/2014 0605   CREATININE 0.81 09/29/2012 1152   CALCIUM 7.9 (L) 11/14/2014 0605   PROT 6.6 09/29/2012 1152   ALBUMIN 4.3 09/29/2012 1152   AST 28 09/29/2012 1152   ALT 19 09/29/2012 1152   ALKPHOS 54 09/29/2012 1152   BILITOT 0.9 09/29/2012 1152   GFRNONAA >60 11/14/2014 0605   GFRAA >60 11/14/2014 0605       Component Value Date/Time   WBC 19.8 (H) 08/24/2016 0555   RBC 3.77 (L) 08/24/2016 0555   HGB 10.8 (L) 08/24/2016 0555   HCT 31.9 (L) 08/24/2016 0555   PLT 191 08/24/2016 0555   MCV 84.6 08/24/2016 0555   MCH 28.6 08/24/2016 0555   MCHC 33.9 08/24/2016 0555   RDW 14.6 08/24/2016 0555   LYMPHSABS 1.4 09/29/2012 1152   MONOABS 0.4 09/29/2012 1152   EOSABS 0.2 09/29/2012 1152   BASOSABS 0.0 09/29/2012 1152    No results found for: POCLITH, LITHIUM   No results found for: PHENYTOIN, PHENOBARB, VALPROATE, CBMZ   .res Assessment: Plan:    Plan:  PDMP reviewed  Zoloft  75mg  daily (50 and 25mg ) Adderall XR 30mg  every morning Adderall 5mg  in the afternoon - not taking every day  Monitor BP between visits while taking stimulant medication.   ADD screening tool completed. Meets DSM-5 criteria for diagnosis of ADD. Psych Central scoring indicates 46/58 - ADD likely.  Monitor BP between visits while taking stimulant medication.   RTC 6 months - will call in 3 months for next set of refills.   10 minutes spent dedicated to the care of this patient on the date of this encounter to include pre-visit review of records, ordering of medication, post visit documentation, and face-to-face time with the patient discussing ADHD and anxiety. Discussed continuing current medication regimen.  Patient advised to contact office with any questions, adverse effects, or acute worsening in signs and symptoms.   Discussed potential benefits, risks, and side effects of stimulants with patient to include increased heart rate, palpitations, insomnia,  increased anxiety,  increased irritability, or decreased appetite.  Instructed patient to contact office if experiencing any significant tolerability issues.   There are no diagnoses linked to this encounter.   Please see After Visit Summary for patient specific instructions.  Future Appointments  Date Time Provider Department Center  02/05/2024  1:00 PM Darya Bigler Nattalie, NP [facility] None    No orders of the defined types were placed in this encounter.     -------------------------------      [1] No Known Allergies
